# Patient Record
Sex: Male | Born: 1960 | Race: Black or African American | Hispanic: No | Smoking: Current every day smoker
Health system: Southern US, Community
[De-identification: ages and names within clinical notes are randomized; demographics above are authoritative.]

## PROBLEM LIST (undated history)

## (undated) ENCOUNTER — Emergency Department (HOSPITAL_COMMUNITY): Payer: Medicaid Other

## (undated) DIAGNOSIS — T7840XA Allergy, unspecified, initial encounter: Secondary | ICD-10-CM

## (undated) DIAGNOSIS — R609 Edema, unspecified: Secondary | ICD-10-CM

## (undated) DIAGNOSIS — T4145XA Adverse effect of unspecified anesthetic, initial encounter: Secondary | ICD-10-CM

## (undated) DIAGNOSIS — K08109 Complete loss of teeth, unspecified cause, unspecified class: Secondary | ICD-10-CM

## (undated) DIAGNOSIS — Z972 Presence of dental prosthetic device (complete) (partial): Secondary | ICD-10-CM

## (undated) DIAGNOSIS — J189 Pneumonia, unspecified organism: Secondary | ICD-10-CM

## (undated) DIAGNOSIS — K219 Gastro-esophageal reflux disease without esophagitis: Secondary | ICD-10-CM

## (undated) DIAGNOSIS — G039 Meningitis, unspecified: Secondary | ICD-10-CM

## (undated) DIAGNOSIS — H905 Unspecified sensorineural hearing loss: Secondary | ICD-10-CM

## (undated) DIAGNOSIS — R05 Cough: Secondary | ICD-10-CM

## (undated) DIAGNOSIS — T8859XA Other complications of anesthesia, initial encounter: Secondary | ICD-10-CM

## (undated) DIAGNOSIS — R053 Chronic cough: Secondary | ICD-10-CM

## (undated) DIAGNOSIS — M1990 Unspecified osteoarthritis, unspecified site: Secondary | ICD-10-CM

## (undated) DIAGNOSIS — H9190 Unspecified hearing loss, unspecified ear: Secondary | ICD-10-CM

## (undated) HISTORY — DX: Allergy, unspecified, initial encounter: T78.40XA

## (undated) HISTORY — PX: TONSILLECTOMY: SUR1361

## (undated) HISTORY — DX: Meningitis, unspecified: G03.9

## (undated) HISTORY — PX: ELBOW SURGERY: SHX618

## (undated) HISTORY — DX: Gastro-esophageal reflux disease without esophagitis: K21.9

## (undated) HISTORY — DX: Unspecified osteoarthritis, unspecified site: M19.90

## (undated) HISTORY — PX: INNER EAR SURGERY: SHX679

---
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
## 2004-05-09 ENCOUNTER — Ambulatory Visit: Payer: Self-pay | Admitting: Psychiatry

## 2004-05-15 ENCOUNTER — Ambulatory Visit: Payer: Self-pay | Admitting: Pain Medicine

## 2004-06-20 ENCOUNTER — Ambulatory Visit: Payer: Self-pay | Admitting: Pain Medicine

## 2004-07-01 ENCOUNTER — Ambulatory Visit: Payer: Self-pay | Admitting: Pain Medicine

## 2004-08-06 ENCOUNTER — Ambulatory Visit: Payer: Self-pay | Admitting: Pain Medicine

## 2006-10-31 ENCOUNTER — Emergency Department: Payer: Self-pay | Admitting: Emergency Medicine

## 2013-06-02 DIAGNOSIS — G039 Meningitis, unspecified: Secondary | ICD-10-CM

## 2013-06-02 HISTORY — DX: Meningitis, unspecified: G03.9

## 2013-07-03 HISTORY — PX: KNEE SURGERY: SHX244

## 2013-07-06 ENCOUNTER — Inpatient Hospital Stay: Payer: Self-pay | Admitting: Internal Medicine

## 2013-07-06 ENCOUNTER — Ambulatory Visit: Payer: Self-pay | Admitting: Neurology

## 2013-07-06 LAB — CBC WITH DIFFERENTIAL/PLATELET
BASOS ABS: 0 10*3/uL (ref 0.0–0.1)
BASOS PCT: 0.2 %
Eosinophil #: 0.2 10*3/uL (ref 0.0–0.7)
Eosinophil %: 0.7 %
HCT: 44.3 % (ref 40.0–52.0)
HGB: 15 g/dL (ref 13.0–18.0)
Lymphocyte #: 0.5 10*3/uL — ABNORMAL LOW (ref 1.0–3.6)
Lymphocyte %: 2.1 %
MCH: 32.2 pg (ref 26.0–34.0)
MCHC: 33.8 g/dL (ref 32.0–36.0)
MCV: 95 fL (ref 80–100)
Monocyte #: 1.5 x10 3/mm — ABNORMAL HIGH (ref 0.2–1.0)
Monocyte %: 6.5 %
Neutrophil #: 21.2 10*3/uL — ABNORMAL HIGH (ref 1.4–6.5)
Neutrophil %: 90.5 %
Platelet: 144 10*3/uL — ABNORMAL LOW (ref 150–440)
RBC: 4.65 10*6/uL (ref 4.40–5.90)
RDW: 13.6 % (ref 11.5–14.5)
WBC: 23.4 10*3/uL — AB (ref 3.8–10.6)

## 2013-07-06 LAB — COMPREHENSIVE METABOLIC PANEL
ALK PHOS: 52 U/L
ALT: 22 U/L (ref 12–78)
ANION GAP: 11 (ref 7–16)
Albumin: 2.5 g/dL — ABNORMAL LOW (ref 3.4–5.0)
BUN: 16 mg/dL (ref 7–18)
Bilirubin,Total: 0.8 mg/dL (ref 0.2–1.0)
CALCIUM: 7.6 mg/dL — AB (ref 8.5–10.1)
CO2: 19 mmol/L — AB (ref 21–32)
CREATININE: 1.16 mg/dL (ref 0.60–1.30)
Chloride: 105 mmol/L (ref 98–107)
EGFR (African American): >60
EGFR (Non-African Amer.): >60
GLUCOSE: 189 mg/dL — AB (ref 65–99)
Osmolality: 276 (ref 275–301)
Potassium: 2.9 mmol/L — ABNORMAL LOW (ref 3.5–5.1)
SGOT(AST): 42 U/L — ABNORMAL HIGH (ref 15–37)
Sodium: 135 mmol/L — ABNORMAL LOW (ref 136–145)
TOTAL PROTEIN: 6.1 g/dL — AB (ref 6.4–8.2)

## 2013-07-06 LAB — URINALYSIS, COMPLETE
Bacteria: NONE SEEN
Bilirubin,UR: NEGATIVE
Glucose,UR: 100 mg/dL (ref 0–75)
Leukocyte Esterase: NEGATIVE
Nitrite: NEGATIVE
PH: 6 (ref 4.5–8.0)
RBC,UR: 58 /HPF (ref 0–5)
SPECIFIC GRAVITY: 1.015 (ref 1.003–1.030)
Squamous Epithelial: <1
WBC UR: 2 /HPF (ref 0–5)

## 2013-07-06 LAB — RAPID INFLUENZA A&B ANTIGENS (ARMC ONLY)

## 2013-07-06 LAB — DRUG SCREEN, URINE
AMPHETAMINES, UR SCREEN: NEGATIVE (ref ?–1000)
Barbiturates, Ur Screen: NEGATIVE (ref ?–200)
Benzodiazepine, Ur Scrn: NEGATIVE (ref ?–200)
CANNABINOID 50 NG, UR ~~LOC~~: POSITIVE (ref ?–50)
Cocaine Metabolite,Ur ~~LOC~~: NEGATIVE (ref ?–300)
MDMA (ECSTASY) UR SCREEN: NEGATIVE (ref ?–500)
Methadone, Ur Screen: NEGATIVE (ref ?–300)
Opiate, Ur Screen: NEGATIVE (ref ?–300)
PHENCYCLIDINE (PCP) UR S: NEGATIVE (ref ?–25)
Tricyclic, Ur Screen: NEGATIVE (ref ?–1000)

## 2013-07-06 LAB — ETHANOL

## 2013-07-06 LAB — SALICYLATE LEVEL: Salicylates, Serum: <1.7 mg/dL

## 2013-07-06 LAB — PROTIME-INR
INR: 1
Prothrombin Time: 13.5 secs (ref 11.5–14.7)

## 2013-07-06 LAB — AMMONIA: Ammonia, Plasma: 16 mcmol/L (ref 11–32)

## 2013-07-06 LAB — CK TOTAL AND CKMB (NOT AT ARMC)
CK, TOTAL: 633 U/L — AB
CK-MB: 6.3 ng/mL — ABNORMAL HIGH (ref 0.5–3.6)

## 2013-07-06 LAB — TROPONIN I: Troponin-I: <0.02 ng/mL

## 2013-07-06 LAB — RAPID HIV-1/2 QL/CONFIRM: HIV-1/2, RAPID QL: NEGATIVE

## 2013-07-06 LAB — ACETAMINOPHEN LEVEL: Acetaminophen: <2 ug/mL

## 2013-07-06 LAB — APTT: Activated PTT: 29.8 secs (ref 23.6–35.9)

## 2013-07-07 LAB — BASIC METABOLIC PANEL
Anion Gap: 5 — ABNORMAL LOW (ref 7–16)
BUN: 15 mg/dL (ref 7–18)
CO2: 26 mmol/L (ref 21–32)
CREATININE: 1.13 mg/dL (ref 0.60–1.30)
Calcium, Total: 8.8 mg/dL (ref 8.5–10.1)
Chloride: 107 mmol/L (ref 98–107)
EGFR (African American): >60
Glucose: 117 mg/dL — ABNORMAL HIGH (ref 65–99)
Osmolality: 278 (ref 275–301)
Potassium: 4.1 mmol/L (ref 3.5–5.1)
Sodium: 138 mmol/L (ref 136–145)

## 2013-07-07 LAB — URINE CULTURE

## 2013-07-07 LAB — MAGNESIUM: Magnesium: 2.6 mg/dL — ABNORMAL HIGH

## 2013-07-07 LAB — CBC WITH DIFFERENTIAL/PLATELET
Basophil #: 0 10*3/uL (ref 0.0–0.1)
Basophil %: 0.1 %
Eosinophil #: 0.1 10*3/uL (ref 0.0–0.7)
Eosinophil %: 0.2 %
HCT: 40.5 % (ref 40.0–52.0)
HGB: 13.7 g/dL (ref 13.0–18.0)
LYMPHS PCT: 3 %
Lymphocyte #: 0.8 10*3/uL — ABNORMAL LOW (ref 1.0–3.6)
MCH: 32.3 pg (ref 26.0–34.0)
MCHC: 33.8 g/dL (ref 32.0–36.0)
MCV: 96 fL (ref 80–100)
MONO ABS: 2.5 x10 3/mm — AB (ref 0.2–1.0)
Monocyte %: 9.8 %
NEUTROS ABS: 21.7 10*3/uL — AB (ref 1.4–6.5)
NEUTROS PCT: 86.9 %
PLATELETS: 125 10*3/uL — AB (ref 150–440)
RBC: 4.23 10*6/uL — ABNORMAL LOW (ref 4.40–5.90)
RDW: 13.8 % (ref 11.5–14.5)
WBC: 25 10*3/uL — AB (ref 3.8–10.6)

## 2013-07-08 DIAGNOSIS — I38 Endocarditis, valve unspecified: Secondary | ICD-10-CM | POA: Diagnosis not present

## 2013-07-08 LAB — BASIC METABOLIC PANEL
Anion Gap: 2 — ABNORMAL LOW (ref 7–16)
BUN: 20 mg/dL — AB (ref 7–18)
CHLORIDE: 107 mmol/L (ref 98–107)
CO2: 28 mmol/L (ref 21–32)
CREATININE: 1.15 mg/dL (ref 0.60–1.30)
Calcium, Total: 8.5 mg/dL (ref 8.5–10.1)
EGFR (African American): >60
EGFR (Non-African Amer.): >60
GLUCOSE: 141 mg/dL — AB (ref 65–99)
Osmolality: 279 (ref 275–301)
POTASSIUM: 4.1 mmol/L (ref 3.5–5.1)
SODIUM: 137 mmol/L (ref 136–145)

## 2013-07-08 LAB — CULTURE, BLOOD (SINGLE)

## 2013-07-08 LAB — CBC WITH DIFFERENTIAL/PLATELET
Basophil #: 0 10*3/uL (ref 0.0–0.1)
Basophil %: 0.1 %
Eosinophil #: 0 10*3/uL (ref 0.0–0.7)
Eosinophil %: 0 %
HCT: 38.1 % — ABNORMAL LOW (ref 40.0–52.0)
HGB: 13 g/dL (ref 13.0–18.0)
Lymphocyte #: 1.1 10*3/uL (ref 1.0–3.6)
Lymphocyte %: 4.4 %
MCH: 32.6 pg (ref 26.0–34.0)
MCHC: 34 g/dL (ref 32.0–36.0)
MCV: 96 fL (ref 80–100)
MONO ABS: 2.8 x10 3/mm — AB (ref 0.2–1.0)
MONOS PCT: 11.1 %
NEUTROS PCT: 84.4 %
Neutrophil #: 21 10*3/uL — ABNORMAL HIGH (ref 1.4–6.5)
Platelet: 122 10*3/uL — ABNORMAL LOW (ref 150–440)
RBC: 3.98 10*6/uL — ABNORMAL LOW (ref 4.40–5.90)
RDW: 13.9 % (ref 11.5–14.5)
WBC: 24.9 10*3/uL — AB (ref 3.8–10.6)

## 2013-07-08 LAB — MAGNESIUM: MAGNESIUM: 2.5 mg/dL — AB

## 2013-07-08 LAB — TRIGLYCERIDES: Triglycerides: 256 mg/dL — ABNORMAL HIGH (ref 0–200)

## 2013-07-08 LAB — PHOSPHORUS: Phosphorus: 1.8 mg/dL — ABNORMAL LOW (ref 2.5–4.9)

## 2013-07-08 LAB — VANCOMYCIN, TROUGH: Vancomycin, Trough: 15 ug/mL (ref 10–20)

## 2013-07-09 LAB — CBC WITH DIFFERENTIAL/PLATELET
BASOS PCT: 0.3 %
Basophil #: 0.1 10*3/uL (ref 0.0–0.1)
EOS PCT: 0.1 %
Eosinophil #: 0 10*3/uL (ref 0.0–0.7)
HCT: 37.4 % — AB (ref 40.0–52.0)
HGB: 12.8 g/dL — ABNORMAL LOW (ref 13.0–18.0)
Lymphocyte #: 1.4 10*3/uL (ref 1.0–3.6)
Lymphocyte %: 7.7 %
MCH: 32.6 pg (ref 26.0–34.0)
MCHC: 34.3 g/dL (ref 32.0–36.0)
MCV: 95 fL (ref 80–100)
MONO ABS: 2.5 x10 3/mm — AB (ref 0.2–1.0)
Monocyte %: 13.4 %
NEUTROS ABS: 14.5 10*3/uL — AB (ref 1.4–6.5)
Neutrophil %: 78.5 %
PLATELETS: 161 10*3/uL (ref 150–440)
RBC: 3.94 10*6/uL — ABNORMAL LOW (ref 4.40–5.90)
RDW: 14 % (ref 11.5–14.5)
WBC: 18.4 10*3/uL — ABNORMAL HIGH (ref 3.8–10.6)

## 2013-07-09 LAB — BASIC METABOLIC PANEL
ANION GAP: 1 — AB (ref 7–16)
BUN: 20 mg/dL — AB (ref 7–18)
CHLORIDE: 108 mmol/L — AB (ref 98–107)
CREATININE: 1 mg/dL (ref 0.60–1.30)
Calcium, Total: 8.6 mg/dL (ref 8.5–10.1)
Co2: 32 mmol/L (ref 21–32)
EGFR (African American): >60
EGFR (Non-African Amer.): >60
Glucose: 147 mg/dL — ABNORMAL HIGH (ref 65–99)
Osmolality: 287 (ref 275–301)
Potassium: 3.9 mmol/L (ref 3.5–5.1)
Sodium: 141 mmol/L (ref 136–145)

## 2013-07-09 LAB — PHOSPHORUS
Phosphorus: 1.8 mg/dL — ABNORMAL LOW (ref 2.5–4.9)
Phosphorus: 2.6 mg/dL (ref 2.5–4.9)

## 2013-07-09 LAB — MAGNESIUM: MAGNESIUM: 2.3 mg/dL

## 2013-07-10 LAB — BASIC METABOLIC PANEL
ANION GAP: 2 — AB (ref 7–16)
BUN: 20 mg/dL — ABNORMAL HIGH (ref 7–18)
CO2: 34 mmol/L — AB (ref 21–32)
CREATININE: 1.05 mg/dL (ref 0.60–1.30)
Calcium, Total: 8.5 mg/dL (ref 8.5–10.1)
Chloride: 108 mmol/L — ABNORMAL HIGH (ref 98–107)
EGFR (African American): >60
EGFR (Non-African Amer.): >60
Glucose: 126 mg/dL — ABNORMAL HIGH (ref 65–99)
Osmolality: 291 (ref 275–301)
POTASSIUM: 3.7 mmol/L (ref 3.5–5.1)
SODIUM: 144 mmol/L (ref 136–145)

## 2013-07-10 LAB — CBC WITH DIFFERENTIAL/PLATELET
BANDS NEUTROPHIL: 2 %
COMMENT - H1-COM3: NORMAL
HCT: 40 % (ref 40.0–52.0)
HGB: 13.2 g/dL (ref 13.0–18.0)
Lymphocytes: 10 %
MCH: 31.5 pg (ref 26.0–34.0)
MCHC: 33 g/dL (ref 32.0–36.0)
MCV: 96 fL (ref 80–100)
MONOS PCT: 13 %
PLATELETS: 233 10*3/uL (ref 150–440)
RBC: 4.19 10*6/uL — AB (ref 4.40–5.90)
RDW: 14 % (ref 11.5–14.5)
Segmented Neutrophils: 68 %
Variant Lymphocyte - H1-Rlymph: 7 %
WBC: 19.9 10*3/uL — AB (ref 3.8–10.6)

## 2013-07-10 LAB — CLOSTRIDIUM DIFFICILE(ARMC)

## 2013-07-10 LAB — PHOSPHORUS: Phosphorus: 2.5 mg/dL (ref 2.5–4.9)

## 2013-07-10 LAB — MAGNESIUM: Magnesium: 2.2 mg/dL

## 2013-07-11 LAB — BASIC METABOLIC PANEL
Anion Gap: 1 — ABNORMAL LOW (ref 7–16)
BUN: 26 mg/dL — AB (ref 7–18)
CHLORIDE: 108 mmol/L — AB (ref 98–107)
CREATININE: 0.89 mg/dL (ref 0.60–1.30)
Calcium, Total: 8.6 mg/dL (ref 8.5–10.1)
Co2: 35 mmol/L — ABNORMAL HIGH (ref 21–32)
EGFR (African American): >60
EGFR (Non-African Amer.): >60
GLUCOSE: 165 mg/dL — AB (ref 65–99)
OSMOLALITY: 295 (ref 275–301)
Potassium: 3.9 mmol/L (ref 3.5–5.1)
Sodium: 144 mmol/L (ref 136–145)

## 2013-07-11 LAB — CBC WITH DIFFERENTIAL/PLATELET
Bands: 1 %
Comment - H1-Com2: NORMAL
HCT: 40.5 % (ref 40.0–52.0)
HGB: 13.2 g/dL (ref 13.0–18.0)
Lymphocytes: 12 %
MCH: 31.5 pg (ref 26.0–34.0)
MCHC: 32.5 g/dL (ref 32.0–36.0)
MCV: 97 fL (ref 80–100)
METAMYELOCYTE: 2 %
MONOS PCT: 5 %
Myelocyte: 2 %
NRBC/100 WBC: 1 /
PLATELETS: 296 10*3/uL (ref 150–440)
RBC: 4.19 10*6/uL — ABNORMAL LOW (ref 4.40–5.90)
RDW: 14.2 % (ref 11.5–14.5)
SEGMENTED NEUTROPHILS: 74 %
Variant Lymphocyte - H1-Rlymph: 4 %
WBC: 21 10*3/uL — ABNORMAL HIGH (ref 3.8–10.6)

## 2013-07-11 LAB — WOUND CULTURE

## 2013-07-11 LAB — PHOSPHORUS: PHOSPHORUS: 3 mg/dL (ref 2.5–4.9)

## 2013-07-11 LAB — MAGNESIUM: Magnesium: 2.5 mg/dL — ABNORMAL HIGH

## 2013-07-12 LAB — PHOSPHORUS: Phosphorus: 2.5 mg/dL (ref 2.5–4.9)

## 2013-07-12 LAB — BASIC METABOLIC PANEL
ANION GAP: 0 — AB (ref 7–16)
BUN: 29 mg/dL — ABNORMAL HIGH (ref 7–18)
CREATININE: 0.95 mg/dL (ref 0.60–1.30)
Calcium, Total: 9.1 mg/dL (ref 8.5–10.1)
Chloride: 111 mmol/L — ABNORMAL HIGH (ref 98–107)
Co2: 35 mmol/L — ABNORMAL HIGH (ref 21–32)
EGFR (African American): >60
EGFR (Non-African Amer.): >60
GLUCOSE: 162 mg/dL — AB (ref 65–99)
OSMOLALITY: 300 (ref 275–301)
Potassium: 4.1 mmol/L (ref 3.5–5.1)
Sodium: 146 mmol/L — ABNORMAL HIGH (ref 136–145)

## 2013-07-12 LAB — MAGNESIUM: Magnesium: 2.3 mg/dL

## 2013-07-13 LAB — CBC WITH DIFFERENTIAL/PLATELET
Basophil #: 0.1 10*3/uL (ref 0.0–0.1)
Basophil %: 0.4 %
EOS ABS: 0 10*3/uL (ref 0.0–0.7)
Eosinophil %: 0.1 %
HCT: 39.2 % — AB (ref 40.0–52.0)
HGB: 13.3 g/dL (ref 13.0–18.0)
LYMPHS PCT: 12.5 %
Lymphocyte #: 2.3 10*3/uL (ref 1.0–3.6)
MCH: 32.7 pg (ref 26.0–34.0)
MCHC: 33.9 g/dL (ref 32.0–36.0)
MCV: 97 fL (ref 80–100)
MONOS PCT: 13.7 %
Monocyte #: 2.5 x10 3/mm — ABNORMAL HIGH (ref 0.2–1.0)
Neutrophil #: 13.4 10*3/uL — ABNORMAL HIGH (ref 1.4–6.5)
Neutrophil %: 73.3 %
Platelet: 415 10*3/uL (ref 150–440)
RBC: 4.06 10*6/uL — ABNORMAL LOW (ref 4.40–5.90)
RDW: 14.4 % (ref 11.5–14.5)
WBC: 18.3 10*3/uL — ABNORMAL HIGH (ref 3.8–10.6)

## 2013-07-13 LAB — BASIC METABOLIC PANEL
Anion Gap: 6 — ABNORMAL LOW (ref 7–16)
BUN: 25 mg/dL — ABNORMAL HIGH (ref 7–18)
CALCIUM: 9.3 mg/dL (ref 8.5–10.1)
CHLORIDE: 108 mmol/L — AB (ref 98–107)
Co2: 31 mmol/L (ref 21–32)
Creatinine: 0.89 mg/dL (ref 0.60–1.30)
EGFR (African American): >60
Glucose: 116 mg/dL — ABNORMAL HIGH (ref 65–99)
Osmolality: 294 (ref 275–301)
Potassium: 3.9 mmol/L (ref 3.5–5.1)
SODIUM: 145 mmol/L (ref 136–145)

## 2013-07-14 LAB — CBC WITH DIFFERENTIAL/PLATELET
BASOS PCT: 0.2 %
Basophil #: 0 10*3/uL (ref 0.0–0.1)
Eosinophil #: 0 10*3/uL (ref 0.0–0.7)
Eosinophil %: 0.2 %
HCT: 39.2 % — ABNORMAL LOW (ref 40.0–52.0)
HGB: 13.2 g/dL (ref 13.0–18.0)
LYMPHS PCT: 11.1 %
Lymphocyte #: 2 10*3/uL (ref 1.0–3.6)
MCH: 32.6 pg (ref 26.0–34.0)
MCHC: 33.8 g/dL (ref 32.0–36.0)
MCV: 97 fL (ref 80–100)
Monocyte #: 2.9 x10 3/mm — ABNORMAL HIGH (ref 0.2–1.0)
Monocyte %: 16.1 %
NEUTROS PCT: 72.4 %
Neutrophil #: 13 10*3/uL — ABNORMAL HIGH (ref 1.4–6.5)
PLATELETS: 420 10*3/uL (ref 150–440)
RBC: 4.05 10*6/uL — ABNORMAL LOW (ref 4.40–5.90)
RDW: 14.2 % (ref 11.5–14.5)
WBC: 18 10*3/uL — ABNORMAL HIGH (ref 3.8–10.6)

## 2013-07-14 LAB — URINALYSIS, COMPLETE
BILIRUBIN, UR: NEGATIVE
Glucose,UR: NEGATIVE mg/dL (ref 0–75)
Ketone: NEGATIVE
Nitrite: NEGATIVE
Ph: 7 (ref 4.5–8.0)
Protein: 30
SPECIFIC GRAVITY: 1.015 (ref 1.003–1.030)
Squamous Epithelial: NONE SEEN
WBC UR: 26 /HPF (ref 0–5)

## 2013-07-14 LAB — CULTURE, BLOOD (SINGLE)

## 2013-07-16 LAB — CSF CELL CT + PROT + GLU PANEL
CSF Tube #: 3
Eosinophil: 0 %
GLUCOSE, CSF: 68 mg/dL (ref 40–75)
LYMPHS PCT: 81 %
Monocytes/Macrophages: 5 %
Neutrophils: 14 %
Other Cells: 0 %
Protein, CSF: 196 mg/dL — ABNORMAL HIGH (ref 15–45)
RBC (CSF): 0 /mm3
WBC (CSF): 51 /mm3

## 2013-07-16 LAB — BASIC METABOLIC PANEL
ANION GAP: 7 (ref 7–16)
BUN: 20 mg/dL — AB (ref 7–18)
CHLORIDE: 98 mmol/L (ref 98–107)
CO2: 26 mmol/L (ref 21–32)
Calcium, Total: 9.4 mg/dL (ref 8.5–10.1)
Creatinine: 1.05 mg/dL (ref 0.60–1.30)
EGFR (African American): >60
EGFR (Non-African Amer.): >60
Glucose: 163 mg/dL — ABNORMAL HIGH (ref 65–99)
OSMOLALITY: 269 (ref 275–301)
POTASSIUM: 4.3 mmol/L (ref 3.5–5.1)
Sodium: 131 mmol/L — ABNORMAL LOW (ref 136–145)

## 2013-07-16 LAB — CBC WITH DIFFERENTIAL/PLATELET
Basophil: 1 %
COMMENT - H1-COM3: NORMAL
HCT: 39.4 % — ABNORMAL LOW (ref 40.0–52.0)
HGB: 13.3 g/dL (ref 13.0–18.0)
Lymphocytes: 9 %
MCH: 32.5 pg (ref 26.0–34.0)
MCHC: 33.7 g/dL (ref 32.0–36.0)
MCV: 96 fL (ref 80–100)
Monocytes: 11 %
Platelet: 450 10*3/uL — ABNORMAL HIGH (ref 150–440)
RBC: 4.09 10*6/uL — AB (ref 4.40–5.90)
RDW: 14 % (ref 11.5–14.5)
Segmented Neutrophils: 79 %
WBC: 18.8 10*3/uL — AB (ref 3.8–10.6)

## 2013-07-16 LAB — URINE CULTURE

## 2013-07-17 LAB — BASIC METABOLIC PANEL
Anion Gap: 4 — ABNORMAL LOW (ref 7–16)
BUN: 20 mg/dL — AB (ref 7–18)
CO2: 27 mmol/L (ref 21–32)
CREATININE: 1.04 mg/dL (ref 0.60–1.30)
Calcium, Total: 9.3 mg/dL (ref 8.5–10.1)
Chloride: 100 mmol/L (ref 98–107)
EGFR (African American): >60
EGFR (Non-African Amer.): >60
GLUCOSE: 128 mg/dL — AB (ref 65–99)
Osmolality: 267 (ref 275–301)
Potassium: 4.5 mmol/L (ref 3.5–5.1)
Sodium: 131 mmol/L — ABNORMAL LOW (ref 136–145)

## 2013-07-17 LAB — CBC WITH DIFFERENTIAL/PLATELET
Basophil #: 0.1 10*3/uL (ref 0.0–0.1)
Basophil %: 0.6 %
EOS PCT: 0.4 %
Eosinophil #: 0.1 10*3/uL (ref 0.0–0.7)
HCT: 38 % — ABNORMAL LOW (ref 40.0–52.0)
HGB: 12.5 g/dL — AB (ref 13.0–18.0)
LYMPHS ABS: 1.4 10*3/uL (ref 1.0–3.6)
LYMPHS PCT: 8.6 %
MCH: 31.4 pg (ref 26.0–34.0)
MCHC: 32.9 g/dL (ref 32.0–36.0)
MCV: 95 fL (ref 80–100)
MONO ABS: 2.9 x10 3/mm — AB (ref 0.2–1.0)
MONOS PCT: 17.7 %
NEUTROS PCT: 72.7 %
Neutrophil #: 11.8 10*3/uL — ABNORMAL HIGH (ref 1.4–6.5)
Platelet: 456 10*3/uL — ABNORMAL HIGH (ref 150–440)
RBC: 3.99 10*6/uL — ABNORMAL LOW (ref 4.40–5.90)
RDW: 13.9 % (ref 11.5–14.5)
WBC: 16.3 10*3/uL — ABNORMAL HIGH (ref 3.8–10.6)

## 2013-07-18 LAB — CBC WITH DIFFERENTIAL/PLATELET
Basophil #: 0.1 10*3/uL (ref 0.0–0.1)
Basophil %: 0.5 %
Eosinophil #: 0.1 10*3/uL (ref 0.0–0.7)
Eosinophil %: 0.8 %
HCT: 37.6 % — ABNORMAL LOW (ref 40.0–52.0)
HGB: 12.4 g/dL — AB (ref 13.0–18.0)
LYMPHS ABS: 1.2 10*3/uL (ref 1.0–3.6)
Lymphocyte %: 6.3 %
MCH: 31.7 pg (ref 26.0–34.0)
MCHC: 33 g/dL (ref 32.0–36.0)
MCV: 96 fL (ref 80–100)
Monocyte #: 2.6 x10 3/mm — ABNORMAL HIGH (ref 0.2–1.0)
Monocyte %: 14.3 %
NEUTROS ABS: 14.3 10*3/uL — AB (ref 1.4–6.5)
NEUTROS PCT: 78.1 %
PLATELETS: 538 10*3/uL — AB (ref 150–440)
RBC: 3.92 10*6/uL — ABNORMAL LOW (ref 4.40–5.90)
RDW: 14 % (ref 11.5–14.5)
WBC: 18.4 10*3/uL — ABNORMAL HIGH (ref 3.8–10.6)

## 2013-07-18 LAB — BASIC METABOLIC PANEL
Anion Gap: 6 — ABNORMAL LOW (ref 7–16)
BUN: 17 mg/dL (ref 7–18)
CALCIUM: 9.5 mg/dL (ref 8.5–10.1)
CHLORIDE: 97 mmol/L — AB (ref 98–107)
CO2: 27 mmol/L (ref 21–32)
Creatinine: 1 mg/dL (ref 0.60–1.30)
EGFR (Non-African Amer.): >60
GLUCOSE: 116 mg/dL — AB (ref 65–99)
OSMOLALITY: 263 (ref 275–301)
Potassium: 4.1 mmol/L (ref 3.5–5.1)
Sodium: 130 mmol/L — ABNORMAL LOW (ref 136–145)

## 2013-07-19 LAB — BASIC METABOLIC PANEL
Anion Gap: 2 — ABNORMAL LOW (ref 7–16)
BUN: 20 mg/dL — ABNORMAL HIGH (ref 7–18)
Calcium, Total: 9.3 mg/dL (ref 8.5–10.1)
Chloride: 100 mmol/L (ref 98–107)
Co2: 31 mmol/L (ref 21–32)
Creatinine: 1.03 mg/dL (ref 0.60–1.30)
EGFR (African American): >60
Glucose: 125 mg/dL — ABNORMAL HIGH (ref 65–99)
OSMOLALITY: 270 (ref 275–301)
POTASSIUM: 3.9 mmol/L (ref 3.5–5.1)
Sodium: 133 mmol/L — ABNORMAL LOW (ref 136–145)

## 2013-07-19 LAB — CBC WITH DIFFERENTIAL/PLATELET
BASOS PCT: 0.8 %
Basophil #: 0.1 10*3/uL (ref 0.0–0.1)
Eosinophil #: 0.2 10*3/uL (ref 0.0–0.7)
Eosinophil %: 1.2 %
HCT: 34.2 % — ABNORMAL LOW (ref 40.0–52.0)
HGB: 11.3 g/dL — AB (ref 13.0–18.0)
LYMPHS PCT: 11 %
Lymphocyte #: 1.8 10*3/uL (ref 1.0–3.6)
MCH: 31.9 pg (ref 26.0–34.0)
MCHC: 33.2 g/dL (ref 32.0–36.0)
MCV: 96 fL (ref 80–100)
Monocyte #: 2.3 x10 3/mm — ABNORMAL HIGH (ref 0.2–1.0)
Monocyte %: 14.2 %
NEUTROS ABS: 11.6 10*3/uL — AB (ref 1.4–6.5)
Neutrophil %: 72.8 %
PLATELETS: 527 10*3/uL — AB (ref 150–440)
RBC: 3.56 10*6/uL — ABNORMAL LOW (ref 4.40–5.90)
RDW: 13.9 % (ref 11.5–14.5)
WBC: 15.9 10*3/uL — ABNORMAL HIGH (ref 3.8–10.6)

## 2013-07-19 LAB — CULTURE, BLOOD (SINGLE)

## 2013-07-19 LAB — CSF CULTURE

## 2013-07-19 LAB — VANCOMYCIN, TROUGH: Vancomycin, Trough: 10 ug/mL (ref 10–20)

## 2013-07-20 LAB — URINALYSIS, COMPLETE
BACTERIA: NONE SEEN
BILIRUBIN, UR: NEGATIVE
Glucose,UR: NEGATIVE mg/dL (ref 0–75)
KETONE: NEGATIVE
Leukocyte Esterase: NEGATIVE
Nitrite: NEGATIVE
Ph: 5 (ref 4.5–8.0)
Protein: 30
RBC,UR: 6 /HPF (ref 0–5)
Specific Gravity: 1.016 (ref 1.003–1.030)
Squamous Epithelial: 1
WBC UR: 9 /HPF (ref 0–5)

## 2013-07-20 LAB — CBC WITH DIFFERENTIAL/PLATELET
BASOS ABS: 0.1 10*3/uL (ref 0.0–0.1)
BASOS PCT: 0.5 %
EOS ABS: 0.3 10*3/uL (ref 0.0–0.7)
Eosinophil %: 2.5 %
HCT: 30.8 % — AB (ref 40.0–52.0)
HGB: 10.8 g/dL — ABNORMAL LOW (ref 13.0–18.0)
Lymphocyte #: 1.5 10*3/uL (ref 1.0–3.6)
Lymphocyte %: 11 %
MCH: 33.3 pg (ref 26.0–34.0)
MCHC: 35 g/dL (ref 32.0–36.0)
MCV: 95 fL (ref 80–100)
MONO ABS: 1.8 x10 3/mm — AB (ref 0.2–1.0)
Monocyte %: 13 %
NEUTROS PCT: 73 %
Neutrophil #: 10.1 10*3/uL — ABNORMAL HIGH (ref 1.4–6.5)
Platelet: 528 10*3/uL — ABNORMAL HIGH (ref 150–440)
RBC: 3.24 10*6/uL — AB (ref 4.40–5.90)
RDW: 13.7 % (ref 11.5–14.5)
WBC: 13.8 10*3/uL — ABNORMAL HIGH (ref 3.8–10.6)

## 2013-07-20 LAB — BASIC METABOLIC PANEL
Anion Gap: 3 — ABNORMAL LOW (ref 7–16)
BUN: 16 mg/dL (ref 7–18)
CALCIUM: 8.8 mg/dL (ref 8.5–10.1)
CHLORIDE: 98 mmol/L (ref 98–107)
CO2: 31 mmol/L (ref 21–32)
Creatinine: 1.05 mg/dL (ref 0.60–1.30)
EGFR (African American): >60
EGFR (Non-African Amer.): >60
Glucose: 114 mg/dL — ABNORMAL HIGH (ref 65–99)
Osmolality: 267 (ref 275–301)
Potassium: 3.7 mmol/L (ref 3.5–5.1)
Sodium: 132 mmol/L — ABNORMAL LOW (ref 136–145)

## 2013-07-21 LAB — BODY FLUID CELL COUNT WITH DIFFERENTIAL
BASOS ABS: 0 %
Eosinophil: 0 %
Lymphocytes: 2 %
Neutrophils: 96 %
Nucleated Cell Count: 25943 /mm3
Other Cells BF: 0 %
Other Mononuclear Cells: 2 %

## 2013-07-21 LAB — CBC WITH DIFFERENTIAL/PLATELET
Basophil #: 0.2 10*3/uL — ABNORMAL HIGH (ref 0.0–0.1)
Basophil %: 1.4 %
EOS PCT: 2.6 %
Eosinophil #: 0.3 10*3/uL (ref 0.0–0.7)
HCT: 27.9 % — ABNORMAL LOW (ref 40.0–52.0)
HGB: 9.4 g/dL — AB (ref 13.0–18.0)
Lymphocyte #: 1.2 10*3/uL (ref 1.0–3.6)
Lymphocyte %: 9.2 %
MCH: 32.3 pg (ref 26.0–34.0)
MCHC: 33.8 g/dL (ref 32.0–36.0)
MCV: 95 fL (ref 80–100)
Monocyte #: 1.5 x10 3/mm — ABNORMAL HIGH (ref 0.2–1.0)
Monocyte %: 11.8 %
NEUTROS ABS: 9.9 10*3/uL — AB (ref 1.4–6.5)
Neutrophil %: 75 %
PLATELETS: 494 10*3/uL — AB (ref 150–440)
RBC: 2.93 10*6/uL — AB (ref 4.40–5.90)
RDW: 13.4 % (ref 11.5–14.5)
WBC: 13.2 10*3/uL — ABNORMAL HIGH (ref 3.8–10.6)

## 2013-07-21 LAB — VANCOMYCIN, TROUGH: VANCOMYCIN, TROUGH: 16 ug/mL (ref 10–20)

## 2013-07-21 LAB — URINE CULTURE

## 2013-07-22 LAB — CREATININE, SERUM
CREATININE: 0.86 mg/dL (ref 0.60–1.30)
EGFR (African American): >60
EGFR (Non-African Amer.): >60

## 2013-07-23 ENCOUNTER — Ambulatory Visit (HOSPITAL_COMMUNITY)
Admission: AD | Admit: 2013-07-23 | Discharge: 2013-07-23 | Disposition: A | Discharge status: Home / Self Care | Payer: Self-pay | Source: Other Acute Inpatient Hospital | Attending: Internal Medicine | Admitting: Internal Medicine

## 2013-07-23 DIAGNOSIS — R4182 Altered mental status, unspecified: Secondary | ICD-10-CM | POA: Insufficient documentation

## 2013-07-23 LAB — CBC WITH DIFFERENTIAL/PLATELET
BASOS ABS: 0.1 10*3/uL (ref 0.0–0.1)
BASOS PCT: 1.2 %
EOS ABS: 0.4 10*3/uL (ref 0.0–0.7)
Eosinophil %: 4 %
HCT: 29.9 % — AB (ref 40.0–52.0)
HGB: 9.9 g/dL — ABNORMAL LOW (ref 13.0–18.0)
LYMPHS ABS: 1.9 10*3/uL (ref 1.0–3.6)
Lymphocyte %: 17 %
MCH: 31.7 pg (ref 26.0–34.0)
MCHC: 33.1 g/dL (ref 32.0–36.0)
MCV: 96 fL (ref 80–100)
MONOS PCT: 10.3 %
Monocyte #: 1.1 x10 3/mm — ABNORMAL HIGH (ref 0.2–1.0)
Neutrophil #: 7.4 10*3/uL — ABNORMAL HIGH (ref 1.4–6.5)
Neutrophil %: 67.5 %
Platelet: 630 10*3/uL — ABNORMAL HIGH (ref 150–440)
RBC: 3.13 10*6/uL — ABNORMAL LOW (ref 4.40–5.90)
RDW: 13.8 % (ref 11.5–14.5)
WBC: 10.9 10*3/uL — ABNORMAL HIGH (ref 3.8–10.6)

## 2013-07-23 LAB — CULTURE, BLOOD (SINGLE)

## 2013-07-23 LAB — CREATININE, SERUM
Creatinine: 0.81 mg/dL (ref 0.60–1.30)
EGFR (African American): >60

## 2013-07-23 LAB — VANCOMYCIN, TROUGH: Vancomycin, Trough: 15 ug/mL (ref 10–20)

## 2013-07-24 LAB — CULTURE, BLOOD (SINGLE)

## 2013-07-25 LAB — WOUND CULTURE

## 2013-08-30 DIAGNOSIS — H9319 Tinnitus, unspecified ear: Secondary | ICD-10-CM | POA: Insufficient documentation

## 2013-08-30 DIAGNOSIS — H903 Sensorineural hearing loss, bilateral: Secondary | ICD-10-CM | POA: Insufficient documentation

## 2014-01-18 DIAGNOSIS — R0683 Snoring: Secondary | ICD-10-CM | POA: Insufficient documentation

## 2014-01-18 DIAGNOSIS — K219 Gastro-esophageal reflux disease without esophagitis: Secondary | ICD-10-CM | POA: Insufficient documentation

## 2014-09-23 NOTE — Op Note (Signed)
PATIENT NAME:  Raymond Gibson, Raymond Gibson MR#:  [id number] DATE OF BIRTH:  1960-06-24  DATE OF PROCEDURE:  07/12/2013  PREOPERATIVE DIAGNOSIS: Right septic knee.  POSTOPERATIVE DIAGNOSIS: Right septic knee.   PROCEDURE: Lavage, right knee.   ANESTHESIA: Local.  INDICATIONS: The patient is a 54 year old, who has been in CCU. He is currently intubated. He has had recurrent effusions. He is still having fevers. He has had prior lavage and now, because of increasing swelling to the knee and repeat lavage indicated.   DESCRIPTION OF PROCEDURE: The timeout procedure was completed. The anterior knee was prepped with a DuraPrep and then 10 mL of 1% Lidocaine was infiltrated in the area of the prior incisions. These incisions were then re-opened with 11 blade and trocars entered. This was hooked up to 3 L bags of saline and suction. Superior medial and superior lateral cannulas being placed. Irrigation was carried out. There was significant fragments of, what appeared to be, debris that came through the system consistent with infected synovium. After thorough irrigation of the knee, the fluid was clear. There was no thick gross pus seen at the start of the case. After thorough irrigation, the trocars were removed and 4 x 4's and tape applied.   ESTIMATED BLOOD LOSS: Minimal.   COMPLICATIONS: None.   SPECIMENS: None.   ____________________________ Laurene Footman, MD [email or web address] D: 07/13/2013 07:54:33 ET T: 07/13/2013 08:15:32 ET JOB#: [id number]  cc: Laurene Footman, MD, <Dictator> Laurene Footman MD ELECTRONICALLY SIGNED 07/14/2013 7:57

## 2014-09-23 NOTE — Consult Note (Signed)
PATIENT NAME:  Raymond Gibson, DISNEY MR#:  [id number] DATE OF BIRTH:  Jan 31, 1961  DATE OF CONSULTATION:  07/06/2013  CONSULTING PHYSICIAN:  Laurene Footman, MD  REASON FOR CONSULTATION: Septic right knee.   HISTORY OF PRESENT ILLNESS: The patient is a 54 year old who apparently has meningitis, acute mental status change. Had been able to work and ambulate normally a week ago. He was seen in the Emergency Room by Dr. Ola Spurr. He had aspiration of over 100 mL of purulent material from the knee.   He was seen after this in the CCU. He is intubated and is unable to give any response. On exam, his knee has the area of prior needle aspiration. His knee right now at present does not have an effusion, but with gross purulent material, he will require flushing of the knee. I think with his mental status, it is not safe to do this inside surgery with anesthesia. Will need to do it at bedside and plan on that tomorrow.   ____________________________ Laurene Footman, MD [email or web address] D: 07/07/2013 07:28:00 ET T: 07/07/2013 07:58:25 ET JOB#: [id number]  cc: Laurene Footman, MD, <Dictator> Laurene Footman MD ELECTRONICALLY SIGNED 07/07/2013 10:41

## 2014-09-23 NOTE — Consult Note (Signed)
CHIEF COMPLAINT and HISTORY:  History of Present Illness 54 year old admitted 07/09/13 with altered mental status/encephalopathy. Concerns of encephalitis vs meningitis. S/p LP today. Also found to have sepsis possibly from right knee infection +blood cultures with strep pneumonae 2/4 on [place]. S/p lavage by ortho of knee. Patient does not provide any history to me at this time. He just had PICC line placed.  Consulted for vent thrombosis, ongoing fevers, concern of infected thrombus, should triple lumen catheter left IJ be removed.   PAST MEDICAL/SURGICAL HISTORY:  Past Medical History:   Denies medical history:   ALLERGIES:  Allergies:  Rocephin: Blisters  Family and Social History:  Family History Stroke  from chart   Social History positive  tobacco, positive ETOH, negative Illicit drugs   + Tobacco Current (within 1 year)   Review of Systems:  ROS Pt not able to provide ROS  Just had PICC placed, drowsy, won't really answer questions much   Physical Exam:  GEN no acute distress   HEENT hearing intact to voice   NECK No masses   RESP normal resp effort   CARD regular rate   VASCULAR ACCESS Right arm picc, central line out   ABD denies tenderness  soft   SKIN normal to palpation   PSYCH drowsy right now   LABS:  Laboratory Results: LabObservation:    06-Feb-15 13:50, Echo Doppler  OBSERVATION   Reason for Test    13-Feb-15 10:44, [place] Color Doppler Upper Extrem Bilat (Arms)  OBSERVATION   Reason for Test LIJ thrombosis, sepsis, ongoing fevers, r/o DVT  Hepatic:    04-Feb-15 10:25, Comprehensive Metabolic Panel  Bilirubin, Total 0.8  Alkaline Phosphatase 52  45-117  NOTE: New Reference Range  04/22/13  SGPT (ALT) 22  SGOT (AST) 42  Total Protein, Serum 6.1  Albumin, Serum 2.5  TDMs:    06-Feb-15 09:22, Vancomycin, Trough LAB  Vancomycin, Trough LAB 15  Routine Micro:    04-Feb-15 10:25, Urine Culture  Micro Text Report   URINE CULTURE    COMMENT                    NO GROWTH IN 18-24 HOURS     ANTIBIOTIC  Specimen Source   IN AND OUT CATH  Culture Comment   NO GROWTH IN 18-24 HOURS   Result(s) reported on 07 Jul 2013 at 08:07AM.    04-Feb-15 10:33, Blood Culture  Organism Name   STREPTOCOCCUS PNEUMONIAE  Ceftriaxone Sensitivity S/0.064  Oxacillin Sensitivity R  Erythromycin Sensitivity S/0.094  Vancomycin Sensitivity S/0.19  Levofloxacin Sensitivity S/0.50  Penicillin MIC S/0.125  Micro Text Report   BLOOD CULTURE    ORGANISM 1                STREPTOCOCCUS PNEUMONIAE    COMMENT                   IN AEROBIC AND ANAEROBIC BOTTLES    COMMENT                   -    GRAM STAIN                GRAM POSITIVE COCCI     ANTIBIOTIC                    ORG#1    CEFTRIAXONE                   S/0.064  ERYTHROMYCIN                  S/0.094    LEVOFLOXACIN                  S/0.50     OXACILLIN                     R          VANCOMYCIN                    S/0.19     PENICILLIN MIC                S/0.125  Organism 1   STREPTOCOCCUS PNEUMONIAE  Culture Comment   IN AEROBIC AND ANAEROBIC BOTTLES  Culture Comment . -  Gram Stain 1   GRAM POSITIVE COCCI  Organism Name   STREPTOCOCCUS PNEUMONIAE  Ceftriaxone Sensitivity S/0.064  Oxacillin Sensitivity R  Erythromycin Sensitivity S/0.125  Vancomycin Sensitivity S/0.25  Levofloxacin Sensitivity S/0.50  Penicillin MIC S/0.125  Micro Text Report   BLOOD CULTURE    ORGANISM 1                STREPTOCOCCUS PNEUMONIAE    COMMENT                   IN AEROBIC AND ANAEROBIC BOTTLES    COMMENT                   -    GRAM STAIN                GRAM POSITIVE COCCI     ANTIBIOTIC                    ORG#1    CEFTRIAXONE                   S/0.064    ERYTHROMYCIN                  S/0.125    LEVOFLOXACIN                  S/0.50     OXACILLIN                     R          VANCOMYCIN                    S/0.25     PENICILLIN MIC                S/0.125  Organism 1   STREPTOCOCCUS PNEUMONIAE   Culture Comment   IN AEROBIC AND ANAEROBIC BOTTLES  Culture Comment . -  Gram Stain 1   GRAM POSITIVE COCCI    04-Feb-15 10:33, Influenza A + B Antigen ([facility])  Micro Text Report   INFLUENZA A+B ANTIGENS    COMMENT                   NEGATIVE FOR INFLUENZA A (ANTIGEN ABSENT)    COMMENT                   NEGATIVE FOR INFLUENZA B (ANTIGEN ABSENT)     ANTIBIOTIC  Comment 1..   NEGATIVE FOR INFLUENZA A (ANTIGEN ABSENT) A negative result does not exclude influenza.  Correlation with clinical impression is required.  Comment 2..   NEGATIVE FOR INFLUENZA  B (ANTIGEN ABSENT)   Result(s) reported on 06 Jul 2013 at 11:35AM.    04-Feb-15 16:37, Wound Aerobic/Anaerobic Culture  Micro Text Report   WOUND AER/ANAEROBIC CULT    COMMENT                   NO GROWTH AEROBICALLY/ANAEROBICALLY IN 4 DAYS    GRAM STAIN                MANY WHITE BLOOD CELLS    GRAM STAIN                MANY RED BLOOD CELLS    GRAM STAIN                MODERATE GRAM POSITIVE COCCI IN CHAINS    GRAM STAIN                RARE YEAST RARE GRAM POSITIVE ROD     ANTIBIOTIC  Specimen Source   right knee  Culture Comment   NO GROWTH AEROBICALLY/ANAEROBICALLY IN 4 DAYS  Gram Stain 1   MANY WHITE BLOOD CELLS  Gram Stain 2   MANY RED BLOOD CELLS  Gram Stain 3   MODERATE GRAM POSITIVE COCCI IN CHAINS  Gram Stain 4   RARE YEAST RARE GRAM POSITIVE ROD   Result(s) reported on 11 Jul 2013 at 10:17AM.    07-Feb-15 12:17, Blood Culture  Micro Text Report   BLOOD CULTURE    COMMENT                   NO GROWTH AEROBICALLY/ANAEROBICALLY IN 5 DAYS     ANTIBIOTIC  Specimen Source   #1 right hand  Culture Comment   NO GROWTH AEROBICALLY/ANAEROBICALLY IN 5 DAYS   Result(s) reported on 14 Jul 2013 at 12:00PM.    07-Feb-15 12:50, Blood Culture  Micro Text Report   BLOOD CULTURE    COMMENT                   NO GROWTH AEROBICALLY/ANAEROBICALLY IN 5 DAYS     ANTIBIOTIC  Specimen Source   line collection  Culture Comment   NO  GROWTH AEROBICALLY/ANAEROBICALLY IN 5 DAYS   Result(s) reported on 14 Jul 2013 at 12:00PM.    08-Feb-15 12:32, Clostridium Difficile  Micro Text Report   CLOSTRIDIUM DIFFICILE    C.DIFFICILE ANTIGEN       C.DIFFICILE GDH ANTIGEN : NEGATIVE    C.DIFFICILE TOXIN A/B     C.DIFFICILE TOXINS A AND B : NEGATIVE    INTERPRETATION            Negative for C. difficile.      ANTIBIOTIC    12-Feb-15 15:35, Urine Culture  Micro Text Report   URINE CULTURE    COMMENT                   NO GROWTH IN 36 HOURS     ANTIBIOTIC  Specimen Source   INDWELLING CATHETER  Culture Comment   NO GROWTH IN 36 HOURS   Result(s) reported on 16 Jul 2013 at 09:49AM.    12-Feb-15 15:45, Blood Culture  Micro Text Report   BLOOD CULTURE    COMMENT                   NO GROWTH IN 18-24 HOURS     ANTIBIOTIC  Culture Comment   NO GROWTH IN 18-24 HOURS   Result(s) reported on 15 Jul 2013 at 04:00PM.    12-Feb-15 15:51, Blood Culture  Micro Text Report   BLOOD CULTURE    COMMENT                   NO GROWTH IN 18-24 HOURS     ANTIBIOTIC  Culture Comment   NO GROWTH IN 18-24 HOURS   Result(s) reported on 15 Jul 2013 at 04:00PM.  Lab:    04-Feb-15 10:45, ABG and Lactic Acid  pH (ABG) 7.48  PCO2 25  PO2 59  FiO2 21  Base Excess -3.0  HCO3 18.6  O2 Saturation 96.4  O2 Device ra  Specimen Site (ABG)   RT RADIAL  Specimen Type (ABG) ARTERIAL  Patient Temp (ABG) 37.0  Result(s) reported on 06 Jul 2013 at 10:50AM.    04-Feb-15 15:35, ABG and Lactic Acid  pH (ABG) 7.39  PCO2 35  PO2 401  FiO2 100  Base Excess -3.1  HCO3 21.2  O2 Saturation 100.7  O2 Device 840  Specimen Site (ABG)   RT RADIAL  Specimen Type (ABG) ARTERIAL  Patient Temp (ABG) 37.0  Mode   ASSIST CONTROL  Vt 500  PEEP 5.0  Mechanical Rate 18    06-Feb-15 10:40, ABG and Lactic Acid  pH (ABG) 7.39  PCO2 44  PO2 79  FiO2 40  Base Excess 1.2  HCO3 26.6  O2 Saturation 97.0  O2 Device 840  Specimen Site (ABG)   RT RADIAL   Specimen Type (ABG) ARTERIAL  Mode   ASSIST CONTROL  Vt 500  PEEP 5.0  Mechanical Rate 18  Result(s) reported on 08 Jul 2013 at 10:48AM.    10-Feb-15 11:35, ABG  pH (ABG) 7.50  PCO2 41  PO2 64  FiO2 30  Base Excess 8.0  HCO3 32.0  O2 Saturation 95.2  O2 Device 840  Specimen Site (ABG)   RT RADIAL  Specimen Type (ABG) ARTERIAL  Patient Temp (ABG) 37.0  Mode   SPONTANEOUS  PSV 8  PEEP 5.0  Result(s) reported on 12 Jul 2013 at 11:43AM.  General Ref:    04-Feb-15 10:25, Acetaminophen, Serum  Acetaminophen, Serum < 2  10-30  POTENTIALLY TOXIC:   > 200 mcg/mL   > 50 mcg/mL at 12 hr after   ingestion   > 300 mcg/mL at 4 hr after   ingestion    81-OFB-51 02:58, Salicylates, Serum  Salicylates, Serum < 1.7  0.0-2.8  Therapeutic 2.8-20.0 mg/dL  Toxic >30.0 mg/dL    05-Feb-15 15:52, C-Reactive Protein  C-Reactive Protein   ========== TEST NAME ==========  ========= RESULTS =========  = REFERENCE RANGE =    C-REACTIVE PROTEIN,QUANT    C-Reactive Protein, Quant  C-Reactive Protein, Quant       [H  311.5 mg/L           ]           0.0-4.9  Results confirmed on  dilution.                  [facility]            No: [id number]            [address], [place], [place] [address]            Lindon Romp, MD         [id number]     Result(s) reported on 08 Jul 2013 at 05:54AM.    05-Feb-15 23:40, HIV Antibodies  HIV Antibodies   ==========  TEST NAME ==========  ========= RESULTS =========  = REFERENCE RANGE =    HIV 1/2 AB W/CONF WB    Panel [id number]  HIV 1/O/2 Abs-Index Value       [   <1.00                ]             <1.00  Index Value: Specimen reactivity relative to the negative cutoff.  HIV 1/O/2 Abs, Qual             [   Non Reactive         ]      Non Reactive                  [facility]            No: [id number]            [address] [place], [place] [address]            Lindon Romp, MD [phone]     Result(s)  reported on 08 Jul 2013 at [facility].  Cardiology:    04-Feb-15 11:44, ED ECG  Ventricular Rate 63  Atrial Rate 63  P-R Interval 144  QRS Duration 90  QT 418  QTc 427  P Axis 16  R Axis 46  T Axis 43  ECG interpretation   Sinus rhythm with Premature supraventricular complexes  Otherwise normal ECG  When compared with ECG of 06-Jul-2013 11:41,  Previous ECG has undetermined rhythm, needs review  ----------unconfirmed----------  Confirmed by OVERREAD, NOT ([id number]), editor PEARSON, BARBARA ([id number]) on 07/07/2013 8:22:37 AM  ED ECG     06-Feb-15 13:50, Echo Doppler  Echo Doppler   REASON FOR EXAM:      COMMENTS:       PROCEDURE: [place] - ECHO DOPPLER COMPLETE(TRANSTHOR)  - Jul 08 2013  1:50PM     RESULT: Echocardiogram Report    Patient Name:   Raymond Gibson Date of Exam: 07/08/2013  Medical Rec #:  [id number]             Custom1:  Date of Birth:  02-13-1961           Height:       74.0 in  Patient Age:    1 years           Weight:       250.0 lb  Patient Gender: M                  BSA:          2.39 m??    Indications: Endocarditis  Sonographer:    Sherrie Sport RDCS  Referring Phys: Abel Presto, J    Summary:   1. Left ventricular ejection fraction, by visual estimation, is 55 to   60%.   2. Normal global left ventricular systolic function.   3. Borderline concentric left ventricular hypertrophy.   4. Mildly increased leftventricular internal cavity size.   5. No evidence of cardiac mass or vegitation. No echocardiographic   evidence of infective endocarditis.  2D AND M-MODE MEASUREMENTS (normal ranges within parentheses):  Left Ventricle:          Normal  IVSd (2D):    0.93 cm (0.7-1.1)  LVPWd (2D):     1.19 cm (0.7-1.1) Aorta/LA:                  Normal  LVIDd (  2D):     5.40 cm (3.4-5.7) Aortic Root (2D): 3.60 cm (2.4-3.7)  LVIDs (2D):     3.91 cm           Left Atrium (2D): 3.60 cm (1.9-4.0)  LV FS (2D):     27.6 %   (>25%)  LV EF (2D):     53.1 %   (>50%)                                     Right Ventricle:                                    RVd (2D):        8.67 cm  LV DIASTOLIC FUNCTION:  MV Peak E: 0.88 m/s E/e' Ratio: 7.70  MV Peak A: 0.54 m/s Decel Time: 215 msec  E/A Ratio: 1.63  SPECTRAL DOPPLER ANALYSIS (where applicable):  Mitral Valve:  MV P1/2 Time: 62.35 msec  MV Area, PHT: 3.53 cm??  Aortic Valve: AoV Max Vel: 1.38 m/s AoV Peak PG: 7.6 mmHg AoV Mean PG:  LVOT Vmax: 0.86 m/s LVOT VTI:  LVOTDiameter: 2.20 cm  AoV Area, Vmax: 2.37 cm?? AoV Area, VTI:  AoV Area, Vmn:  Tricuspid Valve and PA/RV Systolic Pressure: TR Max Velocity: 2.20 m/s RA   Pressure: 10 mmHg RVSP/PASP: 29.3 mmHg  Pulmonic Valve:  PV Max Velocity: 1.10 m/s PV Max PG: 4.8 mmHg PV Mean PG:    PHYSICIAN INTERPRETATION:  Left Ventricle: The left ventricular internal cavity size was mildly   increased. Borderline concentric left ventricular hypertrophy. Global LV   systolic function was normal. Left ventricular ejection fraction, by   visual estimation, is 55 to 60%. Spectral Doppler shows normal pattern of   LV diastolic filling.  Right Ventricle: Normal right ventricular size, wall thickness, and   systolic function.  Left Atrium: The left atrium is normal in size and structure.  Right Atrium: The right atrium is normal in size and structure.  Pericardium: There is no evidence of pericardial effusion.  Mitral Valve: Structurally normal mitral valve, with normal leaflet   excursion; without any evidence of mitral stenosis or significant   regurgitation. No evidence of mitral valve stenosis. Trace mitral valve   regurgitation is seen. No MV vegetation was visualized.  Tricuspid Valve: The tricuspid valve is normal. Trivial tricuspid   regurgitation is visualized. The tricuspid regurgitant velocity is 2.20   m/s, and with an assumed right atrial pressure of 10 mmHg, the estimated   right ventricular systolic pressure is normal at 29.3 mmHg. No TV   vegetation was  visualized.  Aortic Valve: The aortic valve is trileaflet and structurally normal,   with normal leaflet excursion; without any evidence of aortic stenosis or   insufficiency. Trivial aortic valve regurgitation is seen.  Pulmonic Valve: The pulmonic valve is not well seen. No indication of     pulmonic valve regurgitation.  Aorta: The aortic root is normal in size and structure.  Venous: The inferior vena cava was normal.    [id number] Kathlyn Sacramento MD  Electronically signed by [id number] Kathlyn Sacramento MD  Signature Date/Time: 07/11/2013/7:26:36 AM    *** Final ***    IMPRESSION: .        Verified By: Mertie Clause. Fletcher Anon, M.D., MD  Routine Chem:    04-Feb-15  10:25, Ammonia, Plasma  Ammonia, Plasma 16  Result(s) reported on 06 Jul 2013 at 11:12AM.    04-Feb-15 10:25, Comprehensive Metabolic Panel  Glucose, Serum 189  BUN 16  Creatinine (comp) 1.16  Sodium, Serum 135  Potassium, Serum 2.9  Chloride, Serum 105  CO2, Serum 19  Calcium (Total), Serum 7.6  Osmolality (calc) 276  eGFR (African American) >60  eGFR (Non-African American) >60  eGFR values <36mL/min/1.73 m2 may be an indication of chronic  kidney disease (CKD).  Calculated eGFR is useful in patients with stable renal function.  The eGFR calculation will not be reliable in acutely ill patients  when serum creatinine is changing rapidly. It is not useful in   patients on dialysis. The eGFR calculation may not be applicable  to patients at the low and high extremes of body sizes, pregnant  women, and vegetarians.  Anion Gap 11    04-Feb-15 10:25, Ethanol, Serum  Ethanol, S. < 3  Ethanol % (comp) < 0.003  Result(s) reported on 06 Jul 2013 at 11:25AM.    04-Feb-15 10:33, Blood Culture  Result Comment   AEROBIC AND ANAEROBIC - NOTIFIED OF CRITICAL VALUE   - TO Rod Can, CCU AT 2311 07/06/13   - BY [facility].   - READ-BACK PROCESS PERFORMED.   Result(s) reported on 08 Jul 2013 at 08:47AM.  Result Comment   AEROBIC AND  ANAEROBIC - NOTIFIED OF CRITICAL VALUE   - TO Rod Can, CCU AT 2311 07/06/2013   - BY [facility].   - READ-BACK PROCESS PERFORMED.   Result(s) reported on 08 Jul 2013 at 08:46AM.    04-Feb-15 15:35, ABG and Lactic Acid  Result Comment   - NOTIFIED OF CRITICAL VALUE   - HAND DELIVERED   - 07/06/13,1550,Dr. Mariea Clonts   Result(s) reported on 06 Jul 2013 at 03:51PM.    05-Feb-15 49:44, Basic Metabolic Panel (w/Total Calcium)  Glucose, Serum 117  BUN 15  Creatinine (comp) 1.13  Sodium, Serum 138  Potassium, Serum 4.1  Chloride, Serum 107  CO2, Serum 26  Calcium (Total), Serum 8.8  Anion Gap 5  Osmolality (calc) 278  eGFR (African American) >60  eGFR (Non-African American) >60  eGFR values <30mL/min/1.73 m2 may be an indication of chronic  kidney disease (CKD).  Calculated eGFR is useful in patients with stable renal function.  The eGFR calculation will not be reliable in acutely ill patients  when serum creatinine is changing rapidly. It is not useful in   patients on dialysis. The eGFR calculation may not be applicable  to patients at the low and high extremes of body sizes, pregnant  women, and vegetarians.    05-Feb-15 04:15, CBC Profile  Result Comment   DIFFERENTIAL - AUTO DIFFERENTIAL IS CONSISTENT WITH   - MANUAL...MTV   Result(s) reported on 07 Jul 2013 at 06:57AM.    05-Feb-15 15:52, Magnesium, Serum  Magnesium, Serum 2.6  1.8-2.4  THERAPEUTIC RANGE: 4-7 mg/dL  TOXIC: > 10 mg/dL   -----------------------    06-Feb-15 96:75, Basic Metabolic Panel (w/Total Calcium)  Glucose, Serum 141  BUN 20  Creatinine (comp) 1.15  Sodium, Serum 137  Potassium, Serum 4.1  Chloride, Serum 107  CO2, Serum 28  Calcium (Total), Serum 8.5  Anion Gap 2  Osmolality (calc) 279  eGFR (African American) >60  eGFR (Non-African American) >60  eGFR values <4mL/min/1.73 m2 may be an indication of chronic  kidney disease (CKD).  Calculated eGFR is useful in patients with stable renal  function.  The eGFR calculation will not be reliable in acutely ill patients  when serum creatinine is changing rapidly. It is not useful in   patients on dialysis. The eGFR calculation may not be applicable  to patients at the low and high extremes of body sizes, pregnant  women, and vegetarians.    06-Feb-15 03:55, CBC Profile  Result Comment   AUTOMATED DIFFERENTIAL - AUTOMATED DIFFERENTIAL IS CONSISTENT.   - TPL   Result(s) reported on 08 Jul 2013 at 04:15AM.    06-Feb-15 03:55, Magnesium, Serum  Magnesium, Serum 2.5  1.8-2.4  THERAPEUTIC RANGE: 4-7 mg/dL  TOXIC: > 10 mg/dL   -----------------------    06-Feb-15 03:55, Phosphorus, Serum  Phosphorus, Serum 1.8  Result(s) reported on 08 Jul 2013 at 05:10AM.    06-Feb-15 03:55, Triglycerides, Serum  Triglycerides, Serum 256  Result(s) reported on 08 Jul 2013 at 05:10AM.    06-Feb-15 09:22, Vancomycin, Trough LAB  Result Comment   VANCOMYCIN TROUGH - This specimen was collected through an    - indwelling catheter or arterial line.   - A minimum of 22mls of blood was wasted prior     - to collecting the sample.  Interpret   - results with caution.   Result(s) reported on 08 Jul 2013 at 09:58AM.    07-Feb-15 80:88, Basic Metabolic Panel (w/Total Calcium)  Glucose, Serum 147  BUN 20  Creatinine (comp) 1.00  Sodium, Serum 141  Potassium, Serum 3.9  Chloride, Serum 108  CO2, Serum 32  Calcium (Total), Serum 8.6  Anion Gap 1  Osmolality (calc) 287  eGFR (African American) >60  eGFR (Non-African American) >60  eGFR values <75mL/min/1.73 m2 may be an indication of chronic  kidney disease (CKD).  Calculated eGFR is useful in patients with stable renal function.  The eGFR calculation will not be reliable in acutely ill patients  when serum creatinine is changing rapidly. It is not useful in   patients on dialysis. The eGFR calculation may not be applicable  to patients at the low and high extremes of body sizes,  pregnant  women, and vegetarians.  Result Comment   LABS - This specimen was collected through an    - indwelling catheter or arterial line.   - A minimum of 37mls of blood was wasted prior     - to collecting the sample.  Interpret   - results with caution.   Result(s) reported on 09 Jul 2013 at 04:23AM.    07-Feb-15 03:48, CBC Profile  Result Comment   DIFFERENTIAL - SMEAR SCANNED   Result(s) reported on 09 Jul 2013 at [facility]    07-Feb-15 03:48, Magnesium, Serum  Magnesium, Serum 2.3  1.8-2.4  THERAPEUTIC RANGE: 4-7 mg/dL  TOXIC: > 10 mg/dL   -----------------------    07-Feb-15 03:48, Phosphorus, Serum  Phosphorus, Serum 1.8  Result(s) reported on 09 Jul 2013 at 04:24AM.    07-Feb-15 11:30, Phosphorus, Serum  Phosphorus, Serum 2.6  Result(s) reported on 09 Jul 2013 at 11:49AM.    08-Feb-15 11:03, Basic Metabolic Panel (w/Total Calcium)  Glucose, Serum 126  BUN 20  Creatinine (comp) 1.05  Sodium, Serum 144  Potassium, Serum 3.7  Chloride, Serum 108  CO2, Serum 34  Calcium (Total), Serum 8.5  Anion Gap 2  Osmolality (calc) 291  eGFR (African American) >60  eGFR (Non-African American) >60  eGFR values <16mL/min/1.73 m2 may be an indication of chronic  kidney disease (CKD).  Calculated eGFR is useful in patients with stable renal function.  The eGFR calculation will not be reliable in acutely ill patients  when serum creatinine is changing rapidly. It is not useful in   patients on dialysis. The eGFR calculation may not be applicable  to patients at the low and high extremes of body sizes, pregnant  women, and vegetarians.  Result Comment   LABS - This specimen was collected through an    - indwelling catheter or arterial line.   - A minimum of 29mls of blood was wasted prior     - to collecting the sample.  Interpret   - results with caution.   Result(s) reported on 10 Jul 2013 at 04:25AM.    08-Feb-15 04:01, Magnesium, Serum  Magnesium, Serum 2.2   1.8-2.4  THERAPEUTIC RANGE: 4-7 mg/dL  TOXIC: > 10 mg/dL   -----------------------    08-Feb-15 04:01, Phosphorus, Serum  Phosphorus, Serum 2.5  Result(s) reported on 10 Jul 2013 at 04:27AM.    09-Feb-15 77:41, Basic Metabolic Panel (w/Total Calcium)  Glucose, Serum 165  BUN 26  Creatinine (comp) 0.89  Sodium, Serum 144  Potassium, Serum 3.9  Chloride, Serum 108  CO2, Serum 35  Calcium (Total), Serum 8.6  Anion Gap 1  Osmolality (calc) 295  eGFR (African American) >60  eGFR (Non-African American) >60  eGFR values <34mL/min/1.73 m2 may be an indication of chronic  kidney disease (CKD).  Calculated eGFR is useful in patients with stable renal function.  The eGFR calculation will not be reliable in acutely ill patients  when serum creatinine is changing rapidly. It is not useful in   patients on dialysis. The eGFR calculation may not be applicable  to patients at the low and high extremes of body sizes, pregnant  women, and vegetarians.  Result Comment   LABS - This specimen was collected through an    - indwelling catheter or arterial line.   - A minimum of 10mls of blood was wasted prior     - to collecting the sample.  Interpret   - results with caution.   Result(s) reported on 11 Jul 2013 at 04:41AM.    09-Feb-15 04:24, Magnesium, Serum  Magnesium, Serum 2.5  1.8-2.4  THERAPEUTIC RANGE: 4-7 mg/dL  TOXIC: > 10 mg/dL   -----------------------    09-Feb-15 04:24, Phosphorus, Serum  Phosphorus, Serum 3.0  Result(s) reported on 11 Jul 2013 at 04:43AM.    10-Feb-15 28:78, Basic Metabolic Panel (w/Total Calcium)  Glucose, Serum 162  BUN 29  Creatinine (comp) 0.95  Sodium, Serum 146  Potassium, Serum 4.1  Chloride, Serum 111  CO2, Serum 35  Calcium (Total), Serum 9.1  Anion Gap 0  Osmolality (calc) 300  eGFR (African American) >60  eGFR (Non-African American) >60  eGFR values <3mL/min/1.73 m2 may be an indication of chronic  kidney disease (CKD).  Calculated eGFR  is useful in patients with stable renal function.  The eGFR calculation will not be reliable in acutely ill patients  when serum creatinine is changing rapidly. It is not useful in   patients on dialysis. The eGFR calculation may not be applicable  to patients at the low and high extremes of body sizes, pregnant  women, and vegetarians.    10-Feb-15 04:41, Magnesium, Serum  Magnesium, Serum 2.3  1.8-2.4  THERAPEUTIC RANGE: 4-7 mg/dL  TOXIC: > 10 mg/dL   -----------------------    10-Feb-15 04:41, Phosphorus, Serum  Phosphorus, Serum 2.5  Result(s) reported on 12 Jul 2013 at 05:05AM.    11-Feb-15 67:67, Basic Metabolic Panel (  w/Total Calcium)  Glucose, Serum 116  BUN 25  Creatinine (comp) 0.89  Sodium, Serum 145  Potassium, Serum 3.9  Chloride, Serum 108  CO2, Serum 31  Calcium (Total), Serum 9.3  Anion Gap 6  Osmolality (calc) 294  eGFR (African American) >60  eGFR (Non-African American) >60  eGFR values <37mL/min/1.73 m2 may be an indication of chronic  kidney disease (CKD).  Calculated eGFR is useful in patients with stable renal function.  The eGFR calculation will not be reliable in acutely ill patients  when serum creatinine is changing rapidly. It is not useful in   patients on dialysis. The eGFR calculation may not be applicable  to patients at the low and high extremes of body sizes, pregnant  women, and vegetarians.    11-Feb-15 04:51, CBC Profile  Result Comment   labs - This specimen was collected through an    - indwelling catheter or arterial line.   - A minimum of 51mls of blood was wasted prior     - to collecting the sample.  Interpret   - results with caution.  MONOCYTES - CONSISTENT WITH PREVIOUS RESULTS.   Result(s) reported on 13 Jul 2013 at 05:31AM.    12-Feb-15 05:17, CBC Profile  Result Comment   LABS - This specimen was collected through an    - indwelling catheter or arterial line.   - A minimum of 79mls of blood was wasted prior     - to  collecting the sample.  Interpret   - results with caution.  MONOCYTES - CONSISTENT WITH PREVIOUS RESULTS.   Result(s) reported on 14 Jul 2013 at 06:10AM.    14-Feb-15 24:26, Basic Metabolic Panel (w/Total Calcium)  Glucose, Serum 163  BUN 20  Creatinine (comp) 1.05  Sodium, Serum 131  Potassium, Serum 4.3  Chloride, Serum 98  CO2, Serum 26  Calcium (Total), Serum 9.4  Anion Gap 7  Osmolality (calc) 269  eGFR (African American) >60  eGFR (Non-African American) >60  eGFR values <46mL/min/1.73 m2 may be an indication of chronic  kidney disease (CKD).  Calculated eGFR is useful in patients with stable renal function.  The eGFR calculation will not be reliable in acutely ill patients  when serum creatinine is changing rapidly. It is not useful in   patients on dialysis. The eGFR calculation may not be applicable  to patients at the low and high extremes of body sizes, pregnant  women, and vegetarians.    14-Feb-15 10:18, CBC Profile  Result Comment   PLATELETS - SLIGHT PLATELET CLUMPING IN SPECIMEN. ACTUAL   - NUMERICAL COUNT MAY BE SOMEWHAT HIGHER THAN   - THE REPORTED VALUE.   Result(s) reported on 16 Jul 2013 at 11:01AM.  Urine Drugs:    04-Feb-15 10:25, Urine Drug Screen, Qual  Tricyclic Antidepressant, Ur Qual (comp) NEGATIVE  Result(s) reported on 06 Jul 2013 at 11:54AM.  Amphetamines, Urine Qual. NEGATIVE  MDMA, Urine Qual. NEGATIVE  Cocaine Metabolite, Urine Qual. NEGATIVE  Opiate, Urine qual NEGATIVE  Phencyclidine, Urine Qual. NEGATIVE  Cannabinoid, Urine Qual. POSITIVE  Barbiturates, Urine Qual. NEGATIVE  Benzodiazepine, Urine Qual. NEGATIVE  -----------------  The URINE DRUG SCREEN provides only a preliminary, unconfirmed  analytical test result and should not be used for non-medical   purposes.  Clinical consideration and professional judgment should be   applied to any positive drug screen result due to possible  interfering substances.  A more specific  alternate chemical method  must be used in order to  obtain a confirmed analytical result.  Gas  chromatography/mass spectrometry (GC/MS) is the preferred  confirmatory method.  Methadone, Urine Qual. NEGATIVE  Cardiac:    04-Feb-15 10:25, Cardiac Panel  CK, Total 633  [phone]  NOTE: NEW REFERENCE RANGE   07/04/2013  CPK-MB, Serum 6.3  Result(s) reported on 06 Jul 2013 at 11:25AM.    04-Feb-15 10:25, Troponin I  Troponin I < 0.02  0.00-0.05  0.05 ng/mL or less: NEGATIVE   Repeat testing in 3-6 hrs   if clinically indicated.  >0.05 ng/mL: POTENTIAL   MYOCARDIAL INJURY. Repeat   testing in 3-6 hrs if   clinically indicated.  NOTE: An increase or decrease   of 30% or more on serial   testing suggests a   clinically important change  Routine UA:    04-Feb-15 10:25, Urinalysis  Color (UA) Amber  Clarity (UA) Clear  Glucose (UA)   100 mg/dL  Bilirubin (UA) Negative  Ketones (UA) 2+  Specific Gravity (UA) 1.015  Blood (UA) 3+  pH (UA) 6.0  Protein (UA) 75 mg/dL  Nitrite (UA) Negative  Leukocyte Esterase (UA) Negative  Result(s) reported on 06 Jul 2013 at 11:12AM.  RBC (UA) 58 /HPF  WBC (UA) 2 /HPF  Bacteria (UA)   NONE SEEN  Epithelial Cells (UA) <1 /HPF  Mucous (UA) PRESENT  Result(s) reported on 06 Jul 2013 at 11:12AM.    12-Feb-15 15:35, Urinalysis  Color (UA) Amber  Clarity (UA) Clear  Glucose (UA) Negative  Bilirubin (UA) Negative  Ketones (UA) Negative  Specific Gravity (UA) 1.015  Blood (UA) 2+  pH (UA) 7.0  Protein (UA) 30 mg/dL  Nitrite (UA) Negative  Leukocyte Esterase (UA) Trace  Result(s) reported on 14 Jul 2013 at 04:11PM.  RBC (UA) 37 /HPF  WBC (UA) 26 /HPF  Bacteria (UA) TRACE  Epithelial Cells (UA)   NONE SEEN  Mucous (UA) PRESENT  Result(s) reported on 14 Jul 2013 at 04:11PM.  Routine Sero:    04-Feb-15 10:25, Rapid HIV 1/2 Ab Test with Confirmation ([facility])  Rapid HIV 1/2 Ab Test with Confirmation ([facility])   NEG - HIV 1/2 AB This is a  screening test for the presence of HIV-1/2 antibodies.  False-negative and false-positive results can occur.  All preliminary positive samples are sent for confirmatory testing.  Clinical correlation is necessary to assess whether repeat testing  may be needed for negative results.  Routine Coag:    04-Feb-15 10:25, Activated PTT  Activated PTT (APTT) 29.8  A HCT value >55% may artifactually increase the APTT. In one study,  the increase was an average of 19%.  Reference: "Effect on Routine and Special Coagulation Testing Values  of Citrate Anticoagulant Adjustment in Patients with High HCT Values."  American Journal of Clinical Pathology 2006;126:400-405.    04-Feb-15 10:25, Prothrombin Time  Prothrombin 13.5  INR 1.0  INR reference interval applies to patients on anticoagulant therapy.  A single INR therapeutic range for coumarins is not optimal for all  indications; however, the suggested range for most indications is  2.0 - 3.0.  Exceptions to the INR Reference Range may include: Prosthetic heart  valves, acute myocardial infarction, prevention of myocardial  infarction, and combinations of aspirin and anticoagulant. The need  for a higher or lower target INR must be assessed individually.  Reference: The Pharmacology and Management of the Vitamin K   antagonists: the seventh ACCP Conference on Antithrombotic and  Thrombolytic Therapy. [id number].[id number] Sept:126 (3suppl): [id number].  A HCT value >55%  may artifactually increase the PT.  In one study,   the increase was an average of 25%.  Reference:  "Effect on Routine and Special Coagulation Testing Values  of Citrate Anticoagulant Adjustment in Patients with High HCT Values."  American Journal of Clinical Pathology 2006;126:400-405.  Routine Hem:    04-Feb-15 10:25, CBC Profile  WBC (CBC) 23.4  RBC (CBC) 4.65  Hemoglobin (CBC) 15.0  Hematocrit (CBC) 44.3  Platelet Count (CBC) 144  MCV 95  MCH 32.2  MCHC 33.8  RDW 13.6   Neutrophil % 90.5  Lymphocyte % 2.1  Monocyte % 6.5  Eosinophil % 0.7  Basophil % 0.2  Neutrophil # 21.2  Lymphocyte # 0.5  Monocyte # 1.5  Eosinophil # 0.2  Basophil # 0.0  Result(s) reported on 06 Jul 2013 at 10:47AM.    05-Feb-15 04:15, CBC Profile  WBC (CBC) 25.0  RBC (CBC) 4.23  Hemoglobin (CBC) 13.7  Hematocrit (CBC) 40.5  Platelet Count (CBC) 125  MCV 96  MCH 32.3  MCHC 33.8  RDW 13.8  Neutrophil % 86.9  Lymphocyte % 3.0  Monocyte % 9.8  Eosinophil % 0.2  Basophil % 0.1  Neutrophil # 21.7  Lymphocyte # 0.8  Monocyte # 2.5  Eosinophil # 0.1  Basophil # 0.0    06-Feb-15 03:55, CBC Profile  WBC (CBC) 24.9  RBC (CBC) 3.98  Hemoglobin (CBC) 13.0  Hematocrit (CBC) 38.1  Platelet Count (CBC) 122  MCV 96  MCH 32.6  MCHC 34.0  RDW 13.9  Neutrophil % 84.4  Lymphocyte % 4.4  Monocyte % 11.1  Eosinophil % 0.0  Basophil % 0.1  Neutrophil # 21.0  Lymphocyte # 1.1  Monocyte # 2.8  Eosinophil # 0.0  Basophil # 0.0    07-Feb-15 03:48, CBC Profile  WBC (CBC) 18.4  RBC (CBC) 3.94  Hemoglobin (CBC) 12.8  Hematocrit (CBC) 37.4  Platelet Count (CBC) 161  MCV 95  MCH 32.6  MCHC 34.3  RDW 14.0  Neutrophil % 78.5  Lymphocyte % 7.7  Monocyte % 13.4  Eosinophil % 0.1  Basophil % 0.3  Neutrophil # 14.5  Lymphocyte # 1.4  Monocyte # 2.5  Eosinophil # 0.0  Basophil # 0.1    08-Feb-15 04:01, CBC Profile  WBC (CBC) 19.9  RBC (CBC) 4.19  Hemoglobin (CBC) 13.2  Hematocrit (CBC) 40.0  Platelet Count (CBC) 233  Result(s) reported on 10 Jul 2013 at 04:43AM.  MCV 96  MCH 31.5  MCHC 33.0  RDW 14.0  Bands 2  Segmented Neutrophils 68  Lymphocytes 10  Variant Lymphocytes 7  Monocytes 13  Diff Comment 1   TOXIC GRANULATION  Diff Comment 2   PLTS VARIED IN SIZE  Diff Comment 3   RBCs APPEAR NORMAL   Result(s) reported on 10 Jul 2013 at 04:43AM.    09-Feb-15 04:24, CBC Profile  WBC (CBC) 21.0  RBC (CBC) 4.19  Hemoglobin (CBC) 13.2  Hematocrit (CBC) 40.5   Platelet Count (CBC) 296  Result(s) reported on 11 Jul 2013 at 05:54AM.  MCV 97  MCH 31.5  MCHC 32.5  RDW 14.2  Bands 1  Segmented Neutrophils 74  Lymphocytes 12  Variant Lymphocytes 4  Monocytes 5  Metamyelocyte 2  Myelocyte 2  NRBC 1  Diff Comment 1   PLTS VARIED IN SIZE  Diff Comment 2   RBCs APPEAR NORMAL  Diff Comment 3   TOXIC GRANULATION   Result(s) reported on 11 Jul 2013 at 05:54AM.    11-Feb-15  04:51, CBC Profile  WBC (CBC) 18.3  RBC (CBC) 4.06  Hemoglobin (CBC) 13.3  Hematocrit (CBC) 39.2  Platelet Count (CBC) 415  MCV 97  MCH 32.7  MCHC 33.9  RDW 14.4  Neutrophil % 73.3  Lymphocyte % 12.5  Monocyte % 13.7  Eosinophil % 0.1  Basophil % 0.4  Neutrophil # 13.4  Lymphocyte # 2.3  Monocyte # 2.5  Eosinophil # 0.0  Basophil # 0.1    12-Feb-15 05:17, CBC Profile  WBC (CBC) 18.0  RBC (CBC) 4.05  Hemoglobin (CBC) 13.2  Hematocrit (CBC) 39.2  Platelet Count (CBC) 420  MCV 97  MCH 32.6  MCHC 33.8  RDW 14.2  Neutrophil % 72.4  Lymphocyte % 11.1  Monocyte % 16.1  Eosinophil % 0.2  Basophil % 0.2  Neutrophil # 13.0  Lymphocyte # 2.0  Monocyte # 2.9  Eosinophil # 0.0  Basophil # 0.0    14-Feb-15 10:18, CBC Profile  WBC (CBC) 18.8  RBC (CBC) 4.09  Hemoglobin (CBC) 13.3  Hematocrit (CBC) 39.4  Platelet Count (CBC) 450  MCV 96  MCH 32.5  MCHC 33.7  RDW 14.0  Segmented Neutrophils 79  Lymphocytes 9  Monocytes 11  Basophil 1  Diff Comment 1   LARGE PLATELETS  Diff Comment 2   PLTS VARIED IN SIZE  Diff Comment 3   RBCs APPEAR NORMAL   Result(s) reported on 16 Jul 2013 at 11:01AM.   RADIOLOGY:  Radiology Results: XRay:    04-Feb-15 10:50, Chest Portable Single View  Chest Portable Single View  REASON FOR EXAM:    ams  COMMENTS:       PROCEDURE: DXR - DXR PORTABLE CHEST SINGLE VIEW  - Jul 06 2013 10:50AM     CLINICAL DATA:  Weakness and altered mental status.    EXAM:  PORTABLE CHEST - 1 VIEW    COMPARISON:   None.    FINDINGS:  1043 hrs. Low volume film without edema or focal airspace  consolidation. Mild vascular congestion without pulmonary edema.  Cardiopericardial silhouette is borderline increased and likely  accentuated by the low volumes. Telemetry leads overlie the chest.     IMPRESSION:  Low volume film with mild vascular congestion.      Electronically Signed    By: Misty Stanley M.D.    On: 07/06/2013 11:00         Verified By: ERIC A. MANSELL, M.D.,    04-Feb-15 13:37, Chest Portable Single View  Chest Portable Single View  REASON FOR EXAM:    post intubation  COMMENTS:       PROCEDURE: DXR - DXR PORTABLE CHEST SINGLE VIEW  - Jul 06 2013  1:37PM     CLINICAL DATA:  Status post intubation    EXAM:  PORTABLE CHEST - 1 VIEW    COMPARISON:  07/06/2013 1042 hrs    FINDINGS:  Cardiac shadow is stable. An endotracheal tube is been placed in  lies 5 cm above the carina. The lungs are well aerated bilaterally.  No acute bony abnormality is seen.     IMPRESSION:  Endotracheal tube as described.  No acute abnormality is noted.      Electronically Signed    By: Inez Catalina M.D.    On: 07/06/2013 13:37         Verified By: Everlene Farrier, M.D.,    04-Feb-15 17:12, Chest Portable Single View  Chest Portable Single View  REASON FOR EXAM:  central line placement  COMMENTS:       PROCEDURE: DXR - DXR PORTABLE CHEST SINGLE VIEW  - Jul 06 2013  5:12PM     CLINICAL DATA:  Central line placement    EXAM:  PORTABLE CHEST - 1 VIEW    COMPARISON:  Earlier film of the same day    FINDINGS:  Left IJ central line has been placed to the innominate vein. No  pneumothorax. Endotracheal tube stable. .  Some increase in pulmonary vascular congestion. No definite  effusion.  Heart size upper limits normal. Tortuous aorta. Regional bones  unremarkable.     IMPRESSION:  Left IJ central line to the innominate vein, no pneumothorax.      Electronically Signed    By:  Oley Balm M.D.    On: 07/06/2013 17:24         Verified By: Philis Fendt, M.D.,    05-Feb-15 08:01, Knee Right AP and Lateral  Knee Right AP and Lateral  REASON FOR EXAM:    septic knee  COMMENTS:   LMP: (Male)    PROCEDURE: DXR - DXR KNEE RIGHT AP AND LATERAL  - Jul 07 2013  8:01AM     CLINICAL DATA:  Septic knee.    EXAM:  RIGHT KNEE - 1-2 VIEW    COMPARISON:  No priors.    FINDINGS:  Two views of the right knee demonstrate no definite acute displaced  fracture, subluxation or dislocation. No gross areas of bony  osteolysis are appreciated. Joint space narrowing, subchondral  sclerosis, subchondral cyst formation and osteophyte formation are  noted in a tricompartmental distribution, compatible with  osteoarthritis.     IMPRESSION:  1. No acute radiographic abnormality of the right knee.  2. Tricompartmental osteoarthritis.      Electronically Signed    By: Trudie Reed M.D.    On: 07/07/2013 08:02       Verified By: Florencia Reasons, M.D.,    06-Feb-15 09:55, Chest Portable Single View  Chest Portable Single View  REASON FOR EXAM:    pt on vent with dimished left breath sounds  COMMENTS:       PROCEDURE: DXR - DXR PORTABLE CHEST SINGLE VIEW  - Jul 08 2013  9:55AM     CLINICAL DATA:  Ventilator patient.  Diminished left breath sounds.    EXAM:  PORTABLE CHEST -1 VIEW    COMPARISON:  DG CHEST 1V PORT dated 07/06/2013; DG CHEST 1V PORT dated  07/06/2013    FINDINGS:  0944 hr. Endotracheal tube and left IJ central venous catheter  appear unchanged. A nasogastric tube has been placed, projecting  below the diaphragm. There are increased left greater than right  basilar opacities which probably represent atelectasis. A small  amount of pleural fluid on the left is suspected. There is no overt  pulmonary edema or pneumothorax. The heart size is stable.     IMPRESSION:  Interval increased left-greater-than-right basilar opacities and  probable  small left pleural effusion. No pneumothorax.      Electronically Signed    By: Roxy Horseman M.D.    On: 07/08/2013 10:38     Verified By: Gerrianne Scale, M.D.,    08-Feb-15 09:31, Chest Portable Single View  Chest Portable Single View  REASON FOR EXAM:    fever  COMMENTS:       PROCEDURE: DXR - DXR PORTABLE CHEST SINGLE VIEW  - Jul 10 2013  9:31AM  CLINICAL DATA:  Fever    EXAM:  PORTABLE CHEST - 1 VIEW    COMPARISON:  07/08/2013    FINDINGS:  Cardiac shadow is stable. A left central venous line is again seen  in the innominate vein. An endotracheal tube is seen 6 cm above the  carina. A nasogastric catheter is also noted extending towards the  stomach. The lungs are clear bilaterally.     IMPRESSION:  No acute abnormality noted.      Electronically Signed    By: Inez Catalina M.D.    On: 07/10/2013 10:42         Verified By: Everlene Farrier, M.D.,    14-Feb-15 10:48, Knee Right Complete  Knee Right Complete  REASON FOR EXAM:    swelling and pain  COMMENTS:       PROCEDURE: DXR - DXR KNEE RT COMP WITH OBLIQUES  - Jul 16 2013 10:48AM     CLINICAL DATA:  Septic knee, recent right knee aspiration    EXAM:  RIGHT KNEE - COMPLETE 4+ VIEW    COMPARISON:  DG KNEE 1-2V*R* dated 07/07/2013    FINDINGS:  The lateral radiograph is degraded due to obliquity.  No fracture or dislocation. Moderate to severe tricompartmental  degenerative change, worst within the lateral compartment with joint  space loss, subchondralsclerosis and osteophytosis. Evaluation for  joint effusion is degraded due to obliquity on the provided lateral  radiograph though an at least small right-sided joint effusion is  suspected. There is enthesopathic change of the tibial tuberosity.  Potential mild diffuse soft tissue swelling about the knee.    No discrete area of osteolysis to suggest osteomyelitis. No  radiopaque foreign body.     IMPRESSION:  1. Mild apparent diffuse soft tissue  swelling about the knee with  suspected small joint effusion, suboptimally evaluated.  2. No discrete area of osteolysis to suggest osteomyelitis. Further  evaluation could be performed with knee MRI as clinically indicated.      Electronically Signed    By: Sandi Mariscal M.D.    On: 07/16/2013 12:23         Verified By: Aileen Fass, M.D.,  [place]:    13-Feb-15 10:44, [place] Color Doppler Upper Extrem Bilat (Arms)  [place] Color Doppler Upper Extrem Bilat (Arms)  REASON FOR EXAM:    LIJ thrombosis, sepsis, ongoing fevers, r/o DVT  COMMENTS:       PROCEDURE: [place]  - US DOPPLER UP EXTR  BILATERAL  - Jul 15 2013 10:44AM     CLINICAL DATA:  Left internal jugular thrombosis, sepsis, ongoing  fever, question DVT    EXAM:  BILATERAL UPPER EXTREMITY VENOUS DOPPLER ULTRASOUND    TECHNIQUE:  Gray-scale sonography with graded compression, as well as color  Doppler and duplex ultrasound were performed to evaluate the upper  extremity deep venous system from the level of the subclavian vein  and including the jugular, axillary, basilic and upper cephalic  vein. Spectral Doppler was utilized to evaluate flow at rest and  with distal augmentation maneuvers.    COMPARISON:  None    FINDINGS:  Thrombus within deep veins: Thrombus within right cephalic vein. No  additional thrombus.    Compressibility of deep veins: Noncompressible right cephalic vein.  Unable to compress left internal jugular vein due to presence of  catheter. Remaining deep venous systems compressible.    Duplex waveform respiratory phasicity: No flow right cephalic vein,  unable to evaluate.    Duplex  waveform response to augmentation: No flow right cephalic  vein, unable to evaluate    Venous reflux:  None visualized.    Other findings: Sluggish/Rouleaux flow within right axillary and  subclavian veins.     IMPRESSION:  Thrombosis of the right cephalic vein.    Rouleaux flow within the right subclavian and axillary  veins  indicating slow flow though these do not appear thrombosed at this  time.    Limited assessment of left internal jugular vein due to catheter.    No additional deep venous thrombosis identified.      Electronically Signed    By: Lavonia Dana M.D.    On: 07/15/2013 10:56         Verified By: Burnetta Sabin, M.D.,  LabUnknown:    04-Feb-15 10:50, Chest Portable Single View  PACS Image    04-Feb-15 11:32, CT Head Without Contrast  PACS Image    04-Feb-15 13:37, Chest Portable Single View  PACS Image    04-Feb-15 15:14, CT Head With Contrast  PACS Image    04-Feb-15 17:12, Chest Portable Single View  PACS Image    05-Feb-15 08:01, Knee Right AP and Lateral  PACS Image    06-Feb-15 09:55, Chest Portable Single View  PACS Image    06-Feb-15 15:12, CT Head Without Contrast  PACS Image    08-Feb-15 09:31, Chest Portable Single View  PACS Image    10-Feb-15 14:36, CT Cervical Spine Without Contrast  PACS Image    12-Feb-15 18:31, MRI Neck WWO (Soft Tissue)  PACS Image    12-Feb-15 18:32, MRI Brain  With/Without Contrast  PACS Image    13-Feb-15 10:44, [place] Color Doppler Upper Extrem Bilat (Arms)  PACS Image    14-Feb-15 10:48, Knee Right Complete  PACS Image  MRI:    12-Feb-15 18:31, MRI Neck WWO (Soft Tissue)  MRI Neck WWO (Soft Tissue)  REASON FOR EXAM:    pain  COMMENTS:       PROCEDURE: MR  - MR NECK WO/W CONT SOFT TISSUE  - Jul 14 2013  6:31PM     CLINICAL DATA:  Dizziness, evaluate for abscess or infection.    EXAM:  MRI HEAD WITH CONTRAST    MRI NECK WITH CONTRAST    TECHNIQUE:  Multiplanar, multiecho pulse sequences of the brain and surrounding  structures, and neck, to include the craniocervical junction and  cervicothoracic junction, were obtained with intravenous contrast.  Please note, examination was started on July 13, 2013, as  patient could not tolerate further imaging, patient returned on  July 14, 2013 to complete the  examination.    CONTRAST:  20 cc MultiHance    COMPARISON:  CT CERVICAL SPINE W/O CM dated 07/12/2013    FINDINGS:  MRI HEAD FINDINGS    Layering foci of reduced diffusion seen within the right greater  left occipital horns. There is corresponding punctate focus of T1  hyperintense signal and superimposed minimal enhancement. No  abnormal meningeal enhancement suggested at the frontalconvexities,  axial 21 and 22 of 26. No hydrocephalus. The ventricles and sulci  are overall normal for patient's age.    No abnormal parenchymal enhancement, though the degree of motion may  decrease sensitivity for subtle parenchymal signal abnormality. No  reduced diffusion to suggest acute ischemia. No susceptibility  artifact to suggest hemorrhage.    Normal major intracranial vascular flow voids seen at the skull base  with rib os bilateral posterior communicating arteries present. No  paranasal sinus  air-fluid levels. Ocular globes and orbital contents  are nonsuspicious though not tailored for evaluation. Mastoid air  cells appear well-aerated. No abnormal sellar expansion. No  cerebellar tonsillar descent below the foramen magnum.  MRI NECK FINDINGS    Markedly motion degraded examination, the postcontrast sequences are  nearly nondiagnostic.    Abnormal signal within the left internal jugular vein concerning for  thrombosis though, motion degrades evaluation. No definite fluid  collections in the neck. Suspected acute discogenic endplate change  at S8-2, to a lesser extent at C4-5 though motion degrades  evaluation and this is not tailored for cervical spine. Limited  assessment for evaluation of the major salivary glands,  aerodigestive tract due to motion. However, the airway appears  patent.     IMPRESSION:  MRI brain: Abnormal signal and enhancement dependent within the  ventricles in addition to abnormal meningeal enhancement at the  frontal convexities, constellation of findings  highly concerning for  meningitis and debris within the ventricles. No intracranial  abscess.    MRI of the neck: Markedly motion degraded examination, nearly  nondiagnostic. Abnormal signal within the left external jugular vein  concerning for thrombus. No definite abscess.    Findings discussed with and reconfirmed by Dr. Trisha Mangle on July 14, 2013 at 7:15 p.m.      Electronically Signed    By: Elon Alas    On: 07/14/2013 19:17         Verified By: Ricky Ala, M.D.,    12-Feb-15 18:32, MRI Brain  With/Without Contrast  MRI Brain  With/Without Contrast  REASON FOR EXAM:    pain  COMMENTS:       PROCEDURE: MR  - MR BRAIN WO/W CONTRAST  - Jul 14 2013  6:32PM     CLINICAL DATA:  Dizziness, evaluate for abscess or infection.    EXAM:  MRI HEAD WITH CONTRAST    MRI NECK WITH CONTRAST    TECHNIQUE:  Multiplanar, multiecho pulse sequences of the brain and surrounding  structures, and neck, to include the craniocervical junction and  cervicothoracic junction, were obtained with intravenous contrast.  Please note, examination was started on July 13, 2013, as  patient could not tolerate further imaging, patient returned on  July 14, 2013 to complete the examination.    CONTRAST:  20 cc MultiHance    COMPARISON:  CT CERVICAL SPINE W/O CM dated 07/12/2013    FINDINGS:  MRI HEAD FINDINGS    Layering foci of reduced diffusion seen within the right greater  left occipital horns. There is corresponding punctate focus of T1  hyperintense signal and superimposed minimal enhancement. No  abnormal meningeal enhancement suggested at the frontal convexities,  axial 21 and 22 of 26. No hydrocephalus. The ventricles and sulci  are overall normal for patient's age.    No abnormal parenchymal enhancement, though the degree of motion may  decrease sensitivity for subtle parenchymal signal abnormality. No  reduced diffusion to suggest acute ischemia. No  susceptibility  artifact to suggest hemorrhage.    Normal major intracranial vascular flow voids seen at the skull base  with rib os bilateral posterior communicating arteries present. No  paranasal sinus air-fluid levels. Ocular globes and orbital contents  are nonsuspicious though not tailored for evaluation. Mastoid air  cells appear well-aerated. No abnormal sellar expansion. No  cerebellar tonsillar descent below the foramen magnum.  MRI NECKFINDINGS    Markedly motion degraded examination, the postcontrast sequences are  nearly nondiagnostic.  Abnormal signal within the left internal jugular vein concerning for  thrombosis though, motion degrades evaluation. No definite fluid  collections in the neck. Suspected acute discogenic endplate change  at X4-5, to a lesser extent at C4-5 though motion degrades  evaluation and this is not tailored for cervical spine. Limited  assessment for evaluation of the major salivary glands,  aerodigestive tract due to motion. However, the airway appears  patent.     IMPRESSION:  MRI brain: Abnormal signal and enhancement dependent within the  ventricles in addition to abnormal meningeal enhancement at the  frontal convexities, constellation of findings highly concerning for  meningitis and debris within the ventricles. No intracranial  abscess.    MRI of the neck: Markedly motion degraded examination, nearly  nondiagnostic. Abnormal signal within the left external jugular vein  concerning for thrombus. No definite abscess.    Findings discussed with and reconfirmed by Dr. Trisha Mangle on July 14, 2013 at 7:15 p.m.      Electronically Signed    By: Elon Alas    On: 07/14/2013 19:17         Verified By: Ricky Ala, M.D.,  CT:    04-Feb-15 11:32, CT Head Without Contrast  CT Head Without Contrast  REASON FOR EXAM:    ams  COMMENTS:       PROCEDURE: CT  - CT HEAD WITHOUT CONTRAST  - Feb  4 201511:32AM      CLINICAL DATA:  Altered mental status    EXAM:  CT HEAD WITHOUT CONTRAST    TECHNIQUE:  Contiguous axial images were obtained from the base of the skull  through the vertex without intravenous contrast.    COMPARISON:  None.  FINDINGS:  The brainstem and cerebellum appear normal. One could question mild  swelling of the cerebral hemispheres. The ventricles appear slightly  out of proportion to the sulci. I question low density in the right  parieto-occipital region. This is not definite. No side of  intracranial hemorrhage. No skull fracture. No fluid in the sinuses,  middle ears or mastoids.     IMPRESSION:  Question swelling of the cerebral hemispheres. Ventricles are more  prominent than the sulci.    Question low density in the right parieto-occipital region that  could represent an area of infarction. This is not definite.  Electronically Signed    By: Nelson Chimes M.D.    On: 07/06/2013 11:36         Verified By: Jules Schick, M.D.,    04-Feb-15 15:14, CT Head With Contrast  CT Head With Contrast  REASON FOR EXAM:    Meningitis. Cerebral edema/?? CVA.  Cannot do MRI as   pt. is Intubated.  COMMENTS:       PROCEDURE: CT  - CT HEAD WITH CONTRAST  - Jul 06 2013  3:14PM     CLINICAL DATA:  Meningitis.  Stroke.  Cerebral edema.    EXAM:  CT HEAD WITH CONTRAST    TECHNIQUE:  Contiguous axial images were obtained from the base of the skull  through the vertex with intravenous contrast.  CONTRAST:  75 cc [id number]    COMPARISON:  Earlier same day    FINDINGS:  Again the brain in general appears swollen with absence of visible  sulci. Ventricles are slightly prominent. There is continued  question of a small area of low density in the right  parieto-occipital region that could represent a small focal  infarction. No shift.  No focal enhancing lesion.     IMPRESSION:  No focal enhancing lesion. Likely generalized brain swelling. Is  some prominence of  the ventricular system could represent early  communicating hydrocephalus.  Continued question of a small area of low density in the right  parietal occipital brain that could represent an area of focal  infarction. This is not definite.      Electronically Signed    By: Nelson Chimes M.D.    On: 07/06/2013 15:16         Verified By: Jules Schick, M.D.,    06-Feb-15 15:12, CT Head Without Contrast  CT Head Without Contrast  REASON FOR EXAM:    AMS, edema  COMMENTS:       PROCEDURE: CT  - CT HEAD WITHOUT CONTRAST  - Jul 08 2013  3:12PM     CLINICAL DATA:  Altered mental status.    EXAM:  CT HEAD WITHOUT CONTRAST    TECHNIQUE:  Contiguous axial images were obtained from the base of the skull  through the vertex without intravenous contrast.    COMPARISON:  07/06/2013  FINDINGS:  Ventricle size has returned to normal. Cerebral edema also has  resolved. The sulci now appear normal.    Negative for hemorrhage, mass, or infarct. The questionable area of  hypodensity in the right occipital parietal lobe is no longer  present. This may have been artifactual however consideration could  be given to reversible process such as hypertensive encephalopathy.  Cerebritis/meningitis also a consideration.    No acute bony changes.  Mild mucosal edema in the paranasal sinuses.     IMPRESSION:  The brain now has a normal appearance with resolution of acute  hydrocephalus and possible cerebral edema. Differential diagnosis  includes reversible changes of hypertensive encephalopathy as well  as meningeal encephalitis. Correlate with LP and CSF analysis.      Electronically Signed    By: Franchot Gallo M.D.    On: 07/08/2013 15:28         Verified By: Truett Perna, M.D.,    10-Feb-15 14:36, CT Cervical Spine Without Contrast  CT Cervical Spine Without Contrast  REASON FOR EXAM:    pain  COMMENTS:       PROCEDURE: CT  - CT CERVICAL SPINE WO  - Jul 12 2013  2:36PM      CLINICAL DATA:  Patient unable to move his neck.  Neck pain.    EXAM:  CT CERVICAL SPINE WITHOUT CONTRAST    TECHNIQUE:  Multidetector CT imaging of the cervical spine was performed without  intravenous contrast. Multiplanar CT image reconstructions were also  generated.  COMPARISON:  None.    FINDINGS:  Moderate degenerative cervical spondylosis with disc disease and  facet disease. No acute bony findings or destructive bony changes.  The skullbase C1 and C1-2 articulations are maintained. No acute  fracture.    The spinal canal is generous. No evidence of epidural hematoma or  mass. No obvious large cervical disc protrusions or significant  foraminal stenosis.    The lung apices are clear. Emphysematous changes are noted. No  obvious neck mass or adenopathy.   IMPRESSION:  Degenerative cervical spondylosis with disc disease and facet  disease but no acute cervical spine fracture orbone lesion.    No canal or foraminal encroachment is identified.      Electronically Signed    By: Kalman Jewels M.D.    On: 07/12/2013 15:15  Verified By: Marlane Hatcher, M.D.,   ASSESSMENT AND PLAN:  Assessment/Admission Diagnosis 54 year old admitted 07/09/13 with altered mental status/encephalopathy. Concerns of encephalitis vs meningitis. S/p LP today. Also found to have sepsis possibly from right knee infection +blood cultures with strep pneumonae 2/4 on [place]. S/p lavage by ortho of knee. Patient does not provide any history to me at this time. He just had PICC line placed.  Consulted for vent thrombosis, ongoing fevers, concern of infected thrombus, should triple lumen catheter left IJ be removed.  D/w Dr Lucky Cowboy. Thrombosis could be infected secondary to other infectious processes going on. His most recent blood cultures have been negative, but still had fever up to 101.6 in the last 24 hours. Central line has already been removed and PICC line was just placed. Agree that  central line needed to come out. Would have been preferable to have no central venous access for a day or two and use peripheral IV, then place PICC after- but hopefully there will be no issues.   Electronic Signatures: Su Grand (PA-C)  (Signed 14-Feb-15 14:10)  Authored: Chief Complaint and History, PAST MEDICAL/SURGICAL HISTORY, ALLERGIES, Family and Social History, Review of Systems, Physical Exam, LABS, RADIOLOGY, Assessment and Plan   Last Updated: 14-Feb-15 14:10 by Su Grand (PA-C)

## 2014-09-23 NOTE — Consult Note (Signed)
PATIENT NAME:  Raymond Gibson, Raymond Gibson MR#:  [id number] DATE OF BIRTH:  [birth year]  INFECTIOUS DISEASE CONSULT  DATE OF CONSULTATION:  07/06/2013  REFERRING PHYSICIAN:  Dr. Verdell Carmine.  CONSULTING PHYSICIAN:  Cheral Marker. Ola Spurr, MD  REASON FOR CONSULTATION: Pharyngitis and altered mental status.  HISTORY OF PRESENT ILLNESS: This is a 54 year old previously healthy gentleman who was brought in by EMS due to altered mental status and confusion. History is obtained from reviewing the chart. Per the patient's girlfriend, he was in his usual state of health when he developed some chills and upper respiratory symptoms 3 days ago. He continued to feel ill 2 days prior to admission and then developed some right knee pain. He had increasing pain and difficulty walking. He then developed some diarrhea and when he was last checked on this morning, he was altered and barely arousable. He was brought by ambulance to the Emergency Room.   In the Emergency Room there was a lot of agitation and the patient was given Ativan, but due to increase in agitation, he was intubated for airway protection. The patient was noted to have marked leukocytosis. Apparently he had also been complaining of neck pain and headache for a few days prior.   On arrival in the ICU, the patient was intubated so no further history could be obtained. He did have a marked effusion and pain in his right knee,  and even though he was intubated, he winced with pain with movement of the right knee. Arthrocentesis was done and 120 mL of purulent fluid was removed.   PAST MEDICAL AND SOCIAL HISTORY: Per admission note, he has no significant past medical history, social history. He smokes a pack a day. He uses occasional alcohol. No illicit drug use. He lives at home with his girlfriend. 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   FAMILY HISTORY: Mother and father are deceased, but it is unclear from what.   MEDICATIONS: Currently takes no medications  as an outpatient. In the Emergency Room he was given ceftriaxone, vancomycin, and acyclovir. HE APPARENTLY DEVELOPED A RASH TO CEFTRIAXONE.   REVIEW OF SYSTEMS: Unable to be obtained.   PHYSICAL EXAMINATION:  VITAL SIGNS: Temperature on arrival in the Emergency Room was 98.8. Pulse 66, blood pressure 154/92, respirations 33, sat 97% on ventilator.  GENERAL: A well-developed, well-nourished male lying in bed. He is intubated.  HEENT: Pupils are equal. Sclerae are anicteric. Oropharynx has ET tube in place.  NECK: Stiff and he resists flexion and grimaces when attempted to passively flex his neck. No anterior cervical, posterior cervical or supraclavicular lymphadenopathy.  HEART: Tachy but regular.  LUNGS: Clear with good air movement.  ABDOMEN: Soft, nontender, nondistended. No rigidity.  EXTREMITIES: No clubbing, cyanosis or edema. His right knee was markedly swollen and he winced and grimaced with passive range of motion. It was also warm and there was marked effusion.  SKIN: No evidence of purpura, petechiae, or other skin breakdown.  NEUROLOGIC: Intubated.   DIAGNOSTIC DATA: Labs are reviewed. White blood count was elevated at 23.4 with 90% neutrophils. Hemoglobin 15.0, platelets 144. Urine tox screen was positive for cannabis. Blood cultures x2 are pending. FLu swabs are negative. Urinalysis showed 2 white cells. Rapid HIV test was negative. Tylenol and salicylate levels were negative. LFTs were normal except slight elevation of AST at 42. CPK was elevated at 633. Creatinine was 1.16. Sugar was elevated at 180.   IMAGING: CT of his brain showed evidence of probable edema. There was  also questionable low density in the right parietooccipital region that appeared to represent an area of infarction.   Chest x-ray initially showed no acute pulmonary disease.   IMPRESSION: A 54 year old quite ill gentleman in the intensive care unit with likely meningitis as well as a septic knee. He is human  immunodeficiency virus negative. Cultures of the blood and knee are pending. He had a prodrome of possible respiratory illness and then fever and knee pain. Most likely this is Streptococcus pneumoniae or other gram-positive bacteremia causing seeding of his knee as well as of his central nervous system. Lumbar puncture could not be done due to the swelling in the brain.   He also been seen by neurology and he has been seen by orthopedics.   RECOMMENDATIONS:  1.  Continue vancomycin.  2.  SINCE HE DEVELOPED A RASH TO CEFTRIAXONE, I STARTED HIM ON MEROPENEM 2 G Q.8.  3.  Continue acyclovir but this is unlikely to be a viral encephalitis. I will wait to see what culture grows in his blood and knee cultures.  4.  Orthopedics is considering a washout which I would agree with.  5.  Continue critical care management per pulmonary critical care and hospitalist service.   Thank you for the consult. I would be glad to follow with you.   ____________________________ Cheral Marker. Ola Spurr, MD dpf:[id number] D: 07/06/2013 20:36:48 ET T: 07/06/2013 21:26:44 ET JOB#: [id number]  cc: Cheral Marker. Ola Spurr, MD, <Dictator> Ilee Randleman Ola Spurr MD ELECTRONICALLY SIGNED 07/10/2013 7:31

## 2014-09-23 NOTE — Consult Note (Signed)
Brief Consult Note: Diagnosis: presumed septic right knee.   Patient was seen by consultant.   Recommend to proceed with surgery or procedure.   Comments: will need washed out, optimally in OR arthroscopically, but with current critical status may be done at bedside with local anesthetic. Swelling down after aspiration, plan irrigation of knee tomorrow, pending medical status.  Electronic Signatures: Laurene Footman (MD)  (Signed 04-Feb-15 17:07)  Authored: Brief Consult Note   Last Updated: 04-Feb-15 17:07 by Laurene Footman (MD)

## 2014-09-23 NOTE — Op Note (Signed)
PATIENT NAME:  Raymond Gibson, MEHRER MR#:  [id number] DATE OF BIRTH:  02-22-1961  DATE OF PROCEDURE:  07/21/2013  PREOPERATIVE DIAGNOSIS: Septic right knee, recurrent.   POSTOPERATIVE DIAGNOSIS: Septic right knee, recurrent.   PROCEDURE: Right knee arthroscopy, synovectomy.   ANESTHESIA: General.   SURGEON: Laurene Footman, M.D.   DESCRIPTION OF PROCEDURE: The patient was brought to the operating room and after adequate anesthesia was obtained, the knee was prepped and draped in the usual sterile fashion with the arthroscopic leg holder applied, along with tourniquet. Tourniquet was not raised during the case. After patient identification and timeout procedures were completed and after having prepped and draped, the inferolateral portal was made and synovial fluid collected for analysis, cell count with differential and culture aerobic and anaerobic. The fluid was somewhat thin. There were some bits of tissue present as well. The arthroscope was introduced, and inspection revealed extensive synovitis and fibrous debris present within the joint consistent with recent infection. There was also extensive degenerative change present, particularly patellofemoral as well as the lateral compartment. After evaluating all compartments, a shaver was inserted, and extensive tricompartmental synovectomy was carried out, debriding synovium in the suprapatellar pouch and along the medial and lateral gutters as well as in front of the medial and lateral compartments and in the notch. There was also significant fibrous tissue within the joint that was interposed between the femoral and tibial condyles. After a thorough debridement was carried out, the knee was irrigated until clear. There did not appear to be additional loose fragments. After thorough irrigation, the instrumentation was withdrawn. The wound was left open to allow for drainage. Then, 30 mL of 0.25% Sensorcaine with epinephrine was infiltrated for  postoperative analgesia. Xeroform, 4 x 4's Webril and Ace wrap applied along with a Polar Care and knee immobilizer. The patient was then sent to the recovery room in stable condition.   ESTIMATED BLOOD LOSS: Minimal.   COMPLICATIONS: None.   SPECIMEN: Synovial fluid for culture and synovial cyst fluid analysis.   ____________________________ Laurene Footman, MD [id number] D: 07/21/2013 22:20:23 ET T: 07/22/2013 02:12:18 ET JOB#: [id number]  cc: Laurene Footman, MD, <Dictator> Laurene Footman MD ELECTRONICALLY SIGNED 07/22/2013 10:41

## 2014-09-23 NOTE — Discharge Summary (Signed)
PATIENT NAME:  Raymond Gibson, Raymond Gibson MR#:  [id number] DATE OF BIRTH:  Mar 18, 1961  DATE OF ADMISSION:  07/06/2013 DATE OF TRANSFER TO [facility]:  07/23/2013   DISCHARGE DIAGNOSES:   1.  Metabolic encephalopathy due to septic arthritis and meningitis; suspected bacterial meningitis, Streptococcus pneumoniae; sepsis with septic arthritis and meningitis.  2.  Malignant hypertension.  3.  Resolved acute respiratory failure.   CONSULTATIONS: ID consult with Dr. Ola Spurr, neurology consult with Dr. Jayme Cloud, pulmonary and critical care consult with Dr. Patricia Pesa, orthopedic consult with Dr. Rudene Christians.   PRESENT MEDICATIONS:  1.  Normal saline at 100 mL/hour. 2.  Tylenol 325 mg q.4 hours p.r.n. for fever. 3.  Keppra 750 mg IV q.12 hours. 4.  Meropenem 2 grams IV q.8 hours. 5.  Clonidine patch 0.3 mg transdermally weekly. 6.  Hydralazine 10 mg IV q.4-6 hours p.r.n. for systolic blood pressure more than 180/100. 7.  Lidoderm patch to the left neck area and right knee daily. 8.  Flexeril 10 mg p.o. q.8 hours for muscle relaxation. 9.  Diflucan 100 mg IV daily.  10.  Lovenox 40 mg p.o. daily.  11.  Protonix 40 mg p.o. daily.  12.  Motrin 400 mg every 8 hours pain and fever.  13.  Vancomycin 2 grams IV q.12 hours.   IMAGING AND LABORATORY DATA: The patient's white count on admission 23.4, hemoglobin 15, hematocrit 44.3, platelets 144. The patient's alcohol level was less than 3. Troponin less than 0.02 on admission. Normal salicylate levels. Urine was clear; the patient had WBC of only 2,  no bacteria. Urine toxicology was positive for only cannabinoids. Electrolytes on admission: Sodium 135, potassium 2.9, chloride 105, bicarbonate 19, BUN 16, creatinine 1.16, glucose 189. LFTs were within normal limits. Influenza titers were negative. Blood cultures on admission showed Streptococcus pneumoniae in both aerobic and anaerobic bottles; these are the blood cultures from February 4.   The patient's chest x-ray on  admission showed low volumes with pulmonary vascular congestion. Head CT on admission showed swelling of cerebral hemispheres. Ventricles are more prominent than sulci. Chest x-ray showed ET tube is in position and no acute abnormality, no pneumonia.   The patient's lactic acid was 2.2 on admission. Wound cultures from the right knee showed no growth. The patient's kidney function stayed stable. WBC increased to 25 on February 5. The patient's, CRP on February 5 was 311. HIV antibodies were negative.   Echocardiogram showed EF of 55% to 60% with normal LV function, and the patient has no evidence of vegetations per echo.   The patient's CT head was repeated on February 6, which showed brain has normal appearance with resolution of acute hydrocephalus and edema. Differential diagnosis was reversible changes of hypertension as well as meningeal encephalitis.   The patient's repeat blood cultures from February 7 were negative. C. difficile toxins were negative. Chest x-ray from February 8 showed no acute abnormality. White count persistently stayed about 20 until February 10. On February 11, it was 18.3.   The patient had a CT cervical spine without contrast showing degenerative disk disease, no evidence of fractures or bone lesions. The patient's CT was done without contrast.   Urine cultures were negative from February 12. Blood cultures repeatedly were negative on February 12.   The patient also had MRI of the neck with and without contrast, which showed marked motion abnormality, nearly nondiagnostic. MRO of the brain showed abnormal signal enhancement dependent within the ventricles and abnormal meningeal enhancement of  the frontal convexity concerning for meningitis, and debris in the ventricles.   The ultrasound of both arms showed right subclavian and axillary vein slow flow, thrombosis of the cephalic vein.   CSF cultures did not show any growth; that is from February 14. [place]  spotted fever serology is negative. EBV virus serology is negative. HSV 1 and 2 from CSF is negative. ( EBV virus CSF is negative. West Nile virus also is negative from CSF. The patient had an LP done on February 14. Cytomegalovirus is also negative by PCR. Blood cultures from February 17 also are negative. Urine cultures from February 18 from indwelling catheter were negative.   White count improved to 13.8 on February 18.   Wound cultures from the right knee from February 19 from synovial fluids: No growth in 36 hours.   The patient's white count today is 10.9, hemoglobin 9.9, hematocrit 29.9, platelets 630. Vancomycin level this morning 15. The patient's creatinine is 0.81.  VITAL SIGNS: This morning, temperature is 97.6, most recent temperature yesterday morning 100.9. Heart rate 115, blood pressure 110/68, sats 94% on room air.   HOSPITAL COURSE:  1.  Altered mental status. The patient is a 54 year old male with no past medical history, brought in because of altered mental status. The patient's wife was at work, tried to call him multiple times and he did not respond, brought in because of altered mental status. The patient was intubated and admitted to ICU for altered mental status and intubated prophylactically in the ER. The patient was found to have right knee effusion, and the patient initially was admitted for acute respiratory failure. He was on vent with sedation. The patient was successfully extubated on the 10th of February. The patient was seen by Dr. Mortimer Fries in critical care. 2.  Possible meningitis. The patient's CT head on admission showed some swelling, so he was on Solu-Cortef, and he also given her IV antibiotics for meningitis. The patient initially received Rocephin, but HE DEVELOPED A REACTION TO ROCEPHIN shortly after he got in ER, so he was started on vancomycin and also acyclovir. His white count was 23 on admission. The patient did not get an LP because of cerebral edema seen on  the CAT scan. The patient was given IV acyclovir and vancomycin and seen by Dr. Ola Spurr in consultation from [place]. Dr. Ola Spurr recommended IV, continuing IV vancomycin. The patient was thought to have meningitis as well as septic knee. The patient's blood cultures came back as Streptococcus pneumoniae with gram-positive bacteremia seeding his knee and also in central nervous system. The patient continued on vancomycin. THE PATIENT DEVELOPED RASH WITH CEFTRIAXONE, so he is on meropenem 2 grams IV q.8 since February 4 in addition to vancomycin, and he was continued on acyclovir but after HSV serology came back negative, his acyclovir was stopped. The patient was seen by Dr. Rudene Christians in consultation from orthopedics regarding his knee effusion and possible source of sepsis from right knee effusion. The patient had debridement at the bedside on February 5 in ICU, and he had another debridement of the knee on February 10. The patient had another knee lavage done yesterday again on February 19 with right knee arthroscopy and synovectomy. The patient had extensive synovitis and fibrinous debris within the joint with recent infection, and it was lavaged and cleaned. He had irrigation of the knee until it was clear. Knee cultures were sent from aerobic and anaerobic bacteria, but the problem is the patient has been spiking everyday  up to 102 on February 8 and 100.9 on February 20 as well. The  patient's white count was 23 but it is coming down, but he still had fever so Dr. Ola Spurr adjusted vancomycin and  meropenem doses. Blood cultures have been negative repeatedly. His C. difficile has been negative. The patient's chest x-ray also was negative. LP was done again on the 14th of February. So far, cultures are negative but the patient has been on vancomycin and meropenem. The patient does have neck stiffness with negative CT for abscess, and MRI of the neck also did not show any abscess or epidural abscess, so he is on  Flexeril for neck spasms. The patient's MRI of the neck is negative for any abscess. For recurrent fevers, he had blood cultures and urine cultures that were negative. Likely source is the knee, so he had another debridement and lavage done yesterday by Dr. Rudene Christians. The patient has been afebrile since the last 24 hours but still confused, and the patient is right now on vancomycin and meropenem. The patient will be transferred to [facility]. I spoke with Dr. Nolene Ebbs, who is the accepting physician, and the patient will be going there for further care.   For altered mental status, neurology was consulted. The patient was noted to have hearing loss since admission, thought to have (auditory nerve involvement  with meningitis. The patient needs audiology evaluation as an outpatient. He already received Solu-Cortef to decrease the brain swelling when he came. His serology for [place] Nile virus, [facility] spotted fever, CMV, HIV, HSV were all negative. At this time with altered mental status thought to be secondary to sequelae of meningitis, which is slowly resolving. Dr. Valora Corporal has seen the patient, and he suggested EEG on Monday but for now, he is on Keppra prophylactically for seizures. 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   The patient right now has right knee immobilizer to prevent flexion contractures of the right knee. He has very poor p.o. intake, and that is why he is on IV fluids, normal saline at 100 mL/hour. The patient also has malignant hypertension with elevated blood pressure up to 160/100. The patient right now is on hydralazine, clonidine patch, and p.r.n. beta blockers. He will be transferred to [facility] when the bed is available.   TIME SPENT ON DISCHARGE PREPARATION: More than 30 minutes. The patient's family was notified.   ____________________________ Epifanio Lesches, MD sk:jcm D: 07/23/2013 16:48:29 ET T: 07/23/2013 18:26:21  ET JOB#: [id number]  cc: Epifanio Lesches, MD, <Dictator> Epifanio Lesches MD ELECTRONICALLY SIGNED 07/28/2013 13:58

## 2014-09-23 NOTE — Op Note (Signed)
PATIENT NAME:  Raymond Gibson, Raymond Gibson MR#:  [id number] DATE OF BIRTH:  10/26/1960  DATE OF PROCEDURE:  07/07/2013  PREOPERATIVE DIAGNOSIS: Septic right knee.   POSTOPERATIVE DIAGNOSIS: Septic right knee.  PROCEDURE PERFORMED: Lavage, right knee.   ANESTHESIA:  Local, sedation while intubated.   DESCRIPTION OF PROCEDURE: This was done as a necessary procedure. The patient was unable to give consent himself.   INDICATIONS: The patient, the previous day, had been admitted with altered mental status, septicemia with positive blood cultures, possible meningitis and found to have a septic knee on aspiration by Dr. Ola Spurr. When I saw him, his swelling had gone down, but he would need further treatment and with his medical condition, he is not a good candidate for going to the operating room for arthroscopic debridement, so it was felt that a lavage at bedside was appropriate.   First, a timeout procedures carried out. The skin was prepped with alcohol superiorly, medially and laterally, and there is noted to be a large effusion present. A total of 30 mL of 1% Xylocaine was infiltrated in the area of the planned incisions. The knee was then prepped with DuraPrep and squared off in a sterile technique. A skin incision was made superomedially and superolaterally and 2 arthroscopic trocars were inserted with purulent material coming out of the knee. Suction was attached to the superior outflow cannula. The superomedial cannula was directed towards the inferomedial gutter and attached with a Christmas tree adapter to 6 liters of fluid, run sequentially saline and lactated Ringer's. This was used to irrigate out the knee. The fluid eventually became clear after a thorough irrigation of the knee and pressing around the knee to try to remove any debris. The cannulas were removed, and the wound was dressed with 4 x 4's and an Ace wrap. There were no complications. No specimen. EBL was minimal. The patient appeared to  tolerate the procedure well.   ____________________________ Laurene Footman, MD [id number] D: 07/07/2013 18:23:33 ET T: 07/07/2013 19:22:46 ET JOB#: [id number]  cc: Laurene Footman, MD, <Dictator> Laurene Footman MD ELECTRONICALLY SIGNED 07/08/2013 8:22

## 2014-09-23 NOTE — H&P (Signed)
PATIENT NAME:  Raymond Gibson, Raymond Gibson MR#:  [id number] DATE OF BIRTH:  November 19, 1960  DATE OF ADMISSION:  07/06/2013  PRIMARY CARE PHYSICIAN: Does not have one.   CHIEF COMPLAINT: Altered mental status and encephalopathy.   HISTORY OF PRESENT ILLNESS: This is a 54 year old male who was brought in to the hospital by EMS due to altered mental status and confusion. The patient himself currently is intubated and sedated as he could not maintain his airway, therefore most of the history obtained from the girlfriend at bedside. As per the girlfriend, the patient was in his usual state of health total this past Sunday when he developed some chills and upper respiratory symptoms. The patient did not have any sick contacts. He went to work fine on Monday, but still continued to have chills and developed some right knee pain. He was then sent home from work. During Monday, the patient has significant right knee pain and swelling and was not able to ambulate. The patient was having significant difficulty walking due to his right knee pain. The patient thought his knee was swollen.  He bought something over-the-counter as a diuretic to see if it would reduce his swelling. He started having frequency of urination. Also developed some diarrhea shortly thereafter. The patient's girlfriend then went to work as she works overnight. The next morning, she attempted to call him and check on him although he would not answer his phone. She called multiple times and eventually went to see him and he was very lethargic and arousable. She called 911 and he was brought here to the hospital. Upon EMS arrival, the patient was alert and arousable but not oriented and was not acting like himself. The patient came to the ER and was having some agitation and jerking type movements. The patient was given some Ativan, although continued to be significantly altered. The patient was then urgently intubated for airway protection. On routine blood work,  the patient was noted to have leukocytosis and suspected to have possible underlying meningitis/encephalitis. Hospitalist services were contacted for further treatment and evaluation.   REVIEW OF SYSTEMS:  CONSTITUTIONAL: No documented fever. No weight gain, no weight loss.  EYES: No blurred or double vision.  ENT: No tinnitus. No postnasal drip. No redness of the oropharynx.  RESPIRATORY: Positive cough. No wheeze, hemoptysis, no dyspnea.  CARDIOVASCULAR: No chest pain. No orthopnea, no palpitations, no syncope.  GASTROINTESTINAL: No nausea, no vomiting. Positive diarrhea. No melena or hematochezia.  GENITOURINARY: No dysuria, no hematuria.  ENDOCRINE: No polyuria or nocturia.  No heat or cold intolerance.  HEMATOLOGIC: No anemia, no bruising, no bleeding.  INTEGUMENTARY: No rashes. No lesions.  MUSCULOSKELETAL: No arthritis, no swelling, no gout.  NEUROLOGIC: No numbness or tingling. No ataxia. No seizure-type activity.  PSYCHIATRIC: No anxiety, no insomnia, no ADD.  PAST MEDICAL HISTORY:  Has no significant past medical history.   ALLERGIES: ROCEPHIN.   SOCIAL HISTORY: He does smoke about a pack per day, has been smoking for the past 20+ years. Occasional alcohol use. No illicit drug abuse. Lives at home with his girlfriend.   FAMILY HISTORY: Both mother and father are deceased. As per the girlfriend she does not know what the father died from.  The mother died from complications of a stroke and heart disease and a brain tumor.   MEDICATIONS:  He is currently no medications.   PHYSICAL EXAMINATION:  VITAL SIGNS:  Presently is as follows: Temperature 99.5. Pulse 104, respirations 22, blood pressure 159/106, sats 100%  oxygen on a ventilator.  GENERAL: He is a critically ill-appearing patient, intubated and sedated but in no apparent distress.  HEAD, EYES, EARS, NOSE, THROAT: He is atraumatic, normocephalic. His extraocular muscles are intact. His pupils to are equal and reactive to  light and accomodation. Sclerae anicteric. No conjunctival injection. No pharyngeal erythema.  NECK: Supple. There is no jugular venous distention.  No bruits, no lymphadenopathy, no thyromegaly.  HEART: Regular rate and rhythm. No murmurs. No rubs or clicks.  LUNGS: Clear to auscultation bilaterally. No rales or rhonchi. No wheezes.  ABDOMEN: Soft, flat, nontender, nondistended. Has good bowel sounds. No hepatosplenomegaly appreciated.  EXTREMITIES: No evidence of any cyanosis, clubbing, or peripheral edema. Has +2 pedal and radial pulses bilaterally.  NEUROLOGICALLY: The patient is alert, awake, and oriented x 0. The patient is sedated and intubated. No focal motor or sensory deficits appreciated bilaterally.  SKIN: Moist and warm with no rashes.  LYMPHATIC: There is no cervical or axillary lymphadenopathy.   LABORATORY DATA: Serum glucose 189, BUN 16, creatinine 1.16, sodium 135, potassium 2.9, chloride 105, bicarbonate 19. The patient's LFTs are within normal limits. CK 633. Troponin is less than 0.02. White cell count is 23.4, hemoglobin 15, hematocrit 44.3, platelet count 144, INR is 1. Rapid flu is negative. Urinalysis within normal limits. The patient's ABG showed a pH of 7.48, pCO2 25, pO2 59, sats 96%. This was on room air.   The patient did have a chest x-ray done which showed low volume with mild vascular congestion. The patient had a CT of the head done without contrast, which showed questioned swelling of cerebral hemispheres. Ventricles are more prominent and sulci, questioned low density in the right parieto-occipital region that could represent an area of infarction. This is not definite.   ASSESSMENT AND PLAN: This is a 54 year old male with no significant past medical history, who presents the hospital due to altered mental status and encephalopathy and suspected to have meningitis. The patient was quite agitated and confused and was intubated for airway protection.   PROBLEMS: 1.   Altered mental status/encephalopathy. This is likely metabolic encephalopathy from suspected meningitis. Although the patient is afebrile and hemodynamically stable, he does have a leukocytosis of 23,000. The patient will be empirically treated for meningitis bacterial and viral with ceftriaxone, vancomycin and acyclovir although THE PATIENT SEEMS TO HAVE HAD AN ALLERGIC REACTION TO CEFTRIAXONE shortly after he got it. I discussed the case with Dr. Ola Spurr from infectious disease, who will see the patient and place the patient on appropriate antibiotics and antivirals. The patient's CT head did show some cerebral edema and a possible stroke. Therefore, cannot do a lumbar puncture to get an accurate diagnosis. I will get a CT head with contrast as we cannot get an MRI at this time. We will get a neurology consultation.  The patient was seen by Dr. Irish Elders and continue recommendations as per them.   2.  Leukocytosis. This is likely secondary to the meningitis. I will follow white cell count with IV antibiotic therapy.  3.  Hypokalemia: I will go ahead and place the patient on some potassium supplements and follow his potassium in the morning.  4.  Acute respiratory failure. The patient was intubated due to airway protection due to altered mental status.  I will get a pulmonary consult for vent management. The patient is a FULL CODE.   Critical care time spent is 60 minutes    ____________________________ Belia Heman. Verdell Carmine, MD [id number] D: 07/06/2013 14:22:00  ET T: 07/06/2013 14:57:01 ET JOB#: [id number]  cc: Belia Heman. Verdell Carmine, MD, <Dictator> Henreitta Leber MD ELECTRONICALLY SIGNED 07/07/2013 11:53

## 2014-09-23 NOTE — Consult Note (Signed)
Brief Consult Note: Diagnosis: Meningitis, septic knee, Sepsis.   Patient was seen by consultant.   Consult note dictated.   Recommend further assessment or treatment.   Orders entered.   Discussed with Attending MD.   Comments: Cont vanco, meropenem and acyclovir for now Ortho to consider wash out once stable  Agree with neuro LP not safe. continue steroids as well Thank you for consult. Will follow with you.  Electronic Signatures: Angelena Form (MD)  (Signed 04-Feb-15 20:40)  Authored: Brief Consult Note   Last Updated: 04-Feb-15 20:40 by Angelena Form (MD)

## 2014-09-23 NOTE — Consult Note (Signed)
Referring Physician:  Henreitta Leber   Primary Care Physician:  Ashok Norris : [facility], [address], [place], [place], [place] [address], [place] [phone]  Reason for Consult: Admit Date: 06-Jul-2013  Chief Complaint: meningitis  Reason for Consult: meningitis   History of Present Illness: History of Present Illness:   54 year old man admitted with AMS and concern for meningitis vs encephalitis.  5 days ago the patient developed some chills and URI type symptoms.  During Monday he was sent home from work due to right knee pain and swelling.  He then purchased an "over the counter diuretic" to see if it would help the swelling dissipate.  Soon thereafter he began to have diarrhea.  Girlfriend then went to work on third shift.  The next AM she tried calling him and he didnt answer which is unusual for him.  She eventually went back to the house and he was confused and lethargic.  EMS was called and the patient was found to be confused.  In the ED he was seen to have some abnormal "jerking" type movements and was confsed.  His wbc count was elevated and there was concern for infection.  Patient seen to have meningismus with sensitivitiy moving the neck or bending it and concern was raised for meningitis vs encephalitis.  Symptoms moderate severity.  Constant.  No clear provokng factors.  Since admitted and started on antibiotics his cognition has improved.  Still a little somnolent but oriented x 3 today per family in the room.  Able to recognize and communicate normally with a number of visitors, even some visitors who he hadn't seen in awhile per his ex wife.   MEDICAL HISTORY:  known PMH or PSH. HISTORY: his girlfriend his mother died from complications of a stroke and heart disease and a brain tumor   SOCIAL HISTORY: about a pack per day x 20+ years. Occasional alcohol use. No illicit drug abuse. Lives at home with his girlfriend.   on any medications.   ROCEPHIN.         ROS:  General pain  meningismus neck pain   HEENT no complaints   Lungs no complaints   Cardiac no complaints   GI no complaints   GU no complaints   Musculoskeletal no complaints   Extremities no complaints   Skin no complaints   Endocrine no complaints   Psych no complaints   Past Medical/Surgical Hx:  Denies medical history:   [facility] Current Meds:  Dextrose 5%-NaCl 0.45% w/KCl 7mEq, 1000 ml at 75 ml/hr  Acetaminophen * tablet, ( Tylenol (325 mg) tablet)  650 mg Oral q4h PRN for pain or temp. greater than 100.4  - Indication: Pain/Fever  Ondansetron injection, ( Zofran injection )  4 mg, IV push, q4h PRN for Nausea/Vomiting  Indication: Nausea/ Vomiting  hydrALAZINE HCl injection, ( Apresoline injection )  10 mg, IV push, q4-6h PRN for SBP > 180 or DBP > 100  Indication: Hypertension/ CHF  levETIRAcetam injection,  ( Keppra injection )  750 mg in Dextrose 5% 100 ml, IV Piggyback, q12h, Infuse over 15 minute(s), Mixed in 100 ml given over 15 minutes.  Meropenem Injection, 2 gram in Sodium Chloride 0.9% 100 ml, IV Piggyback, q8h, Infuse over 3 hour(s)  Indication: Infection, <<<Extended infusion protocol>>>  Famotidine injection, ( Pepcid injection )  20 mg, IV Piggyback, q12h, Infuse over 30 minute(s)  Indication: Active Ulcer/Hypersecretory Conditions/Heartburn/Paclitaxel Reactions  Insulin SS -Novolog injection, Subcutaneous, FSBS  q6h  give no insulin if FSBS 0 - 140     2 unit(s) if FSBS 141 - 200     4 unit(s) if FSBS 201 - 250     6 unit(s) if FSBS 251 - 300     8 unit(s) if FSBS 301 - 350     10 unit(s) if FSBS 351 - 400  Call MD if Blood Glucose greater than 400, [Waste Code: Black]  Enoxaparin injection, ( Lovenox injection )  40 mg, Subcutaneous, q24h  Indication: Prophylaxis or treatment of thromboembolic disorders, Monitor Anticoags per hospital protocol  cloNIDine  0.$RemoveBefo'3mg'QlfCaqRaOLM$  patch,  ( Catapres TTS-3 patch )  1 patch Transdermal  weekly  -Indication:Hypertension/ Withdrawal Symptoms  Glycopyrrolate injection, 0.4 mg, IV push, q4h PRN for increased secreations  Indication: Decreased Secretions Pre-Surgery/Reversal Neuromuscular Bloc/GI Disorders, [Waste Code: SendToRx]  Orphenadrine SA tablet, 100 mg Oral bid  - Indication: Muscle Spasms/Supportive Therapy in Tetanus  Instructions:  DO NOT CRUSH  MorphINE  injection, 2 to 4 mg, IV push, q4h PRN for pain  Indication: Pain, [Med Admin Window: 30 mins before or after scheduled dose]  Lidocaine 5% patch, ( Lidoderm 5% patch )  Apply to to L neck area and  R knee daily  Instructions: APPLY daily at 9 am, REMOVE at 9 pm  Location of patch 1 -  Location of patch 2 -  Line Flush - Normal Saline, 5 ml, IV push, daily  Indication: Line patency, Flush each UNUSED port with 5 ml saline, 5 ml to each unused lumen daily  Line Flush - Normal Saline, 5 to 10 ml, IV push, ud PRN for line patency, Flush each lumen with 5 ml before and 10 ml after each med admin, TPN, blood draw or blood administration.  Line Flush Heparin 10 units/ml injection, 5 ml, IV push, ud PRN for line patency  Indication: Line Patency, Monitor Anticoags per hospital protocol, Flush lumen with 50 units after med admin, TPN, blood draws or blood administration.  Line Flush Heparin 10 units/ml injection, 5 ml, IV push, daily  Indication: Line Patency, Monitor Anticoags per hospital protocol, Flush each UNUSED port with 50 units Heparin every 24 hours., Flush each unused lumen with 50 units every 24 hours.  Allergies:  Rocephin: Blisters  Vital Signs: **Vital Signs.:   13-Feb-15 12:13  Vital Signs Type Routine  Temperature Temperature (F) 100.1  Celsius 37.8  Pulse Pulse 244  Systolic BP Systolic BP 975  Diastolic BP (mmHg) Diastolic BP (mmHg) 82  Mean BP 99  Pulse Ox % Pulse Ox % 93  Pulse Ox Activity Level  At rest  Oxygen Delivery 3L   EXAM: GENERAL: Sleepy, but overall pleasant and in NAD.   Normocephalic and atraumatic.  EYES: Funduscopic exam shows normal disc size, appearance and C/D ratio without clear evidence of papilledema.  CARDIOVASCULAR: S1 and S2 sounds are within normal limits, without murmurs, gallops, or rubs.  MUSCULOSKELETAL: Moderate to severe meningismus is noted on exam with passive head flexion. Bulk - Normal Tone - Normal Pronator Drift - Absent bilaterally. Ambulation - Gait and station are deferred due to falls risk.  R/L 5/5    Shoulder abduction (deltoid/supraspinatus, axillary/suprascapular n, C5) 5/5    Elbow flexion (biceps brachii, musculoskeletal n, C5-6) 5/5    Elbow extension (triceps, radial n, C7) 5/5    Finger adduction (interossei, ulnar n, T1)   5/5    Hip flexion (iliopsoas, L1/L2) 5/5    Knee flexion (hamstrings,  sciatic n, L5/S1) 5/5    Knee extension (quadriceps, femoral n, L3/4) 5/5    Ankle dorsiflexion (tibialis anterior, deep fibular n, L4/5) 5/5    Ankle plantarflexion (gastroc, tibial n, S1)  NEUROLOGICAL: MENTAL STATUS: Still rather somnolent but when aroused, he is oriented to person, place and time.  Recent and remote memory are intact.  Attention span and concentration are intact.  Naming, repetition, comprehension and expressive speech are within normal limits.  Patient's fund of knowledge is within normal limits for educational level.  CRANIAL NERVES: Normal    CN II (normal visual acuity and visual fields) Normal    CN III, IV, VI (extraocular muscles are intact) Normal    CN V (facial sensation is intact bilaterally) Normal    CN VII (facial strength is intact bilaterally) Normal    CN VIII (hearing is intact bilaterally) Normal    CN IX/X (palate elevates midline, normal phonation) Normal    CN XI (shoulder shrug strength is normal and symmetric) Normal    CN XII (tongue protrudes midline)   SENSATION: Intact to pain and temp bilaterally (spinothalamic tracts) Intact to position and vibration bilaterally  (dorsal columns)   REFLEXES: R/L 2+/2+    Biceps 2+/2+    Brachioradialis   2+/2+    Patellar 2+/2+    Achilles   COORDINATION/CEREBELLAR: Finger to nose testing is within normal limits..  Lab Results:  LabObservation:  06-Feb-15 13:50   OBSERVATION Reason for Test  13-Feb-15 10:44   OBSERVATION Reason for Test LIJ thrombosis, sepsis, ongoing fevers, r/o DVT  Hepatic:  04-Feb-15 10:25   Bilirubin, Total 0.8  Alkaline Phosphatase 52 (45-117 NOTE: New Reference Range 04/22/13)  SGPT (ALT) 22  SGOT (AST)  42  Total Protein, Serum  6.1  Albumin, Serum  2.5  TDMs:  06-Feb-15 09:22   Vancomycin, Trough LAB 15  Routine Micro:  04-Feb-15 10:25   Micro Text Report URINE CULTURE   COMMENT                   NO GROWTH IN 18-24 HOURS   ANTIBIOTIC                       Culture Comment NO GROWTH IN 18-24 HOURS  Result(s) reported on 07 Jul 2013 at 08:07AM.  Specimen Source IN AND OUT CATH    10:33   Micro Text Report BLOOD CULTURE   ORGANISM 1                STREPTOCOCCUS PNEUMONIAE   COMMENT                   IN AEROBIC AND ANAEROBIC BOTTLES   COMMENT                   -   GRAM STAIN                GRAM POSITIVE COCCI   ANTIBIOTIC                    ORG#1   CEFTRIAXONE                   S/0.064   ERYTHROMYCIN                  S/0.094   LEVOFLOXACIN                  S/0.50    OXACILLIN  R         VANCOMYCIN                    S/0.19    PENICILLIN MIC                S/0.125  Micro Text Report BLOOD CULTURE   ORGANISM 1                STREPTOCOCCUS PNEUMONIAE   COMMENT                   IN AEROBIC AND ANAEROBIC BOTTLES   COMMENT                   -   GRAM STAIN                GRAM POSITIVE COCCI   ANTIBIOTIC                    ORG#1   CEFTRIAXONE                   S/0.064   ERYTHROMYCIN                  S/0.125   LEVOFLOXACIN                  S/0.50    OXACILLIN                     R         VANCOMYCIN                    S/0.25    PENICILLIN MIC                 S/0.125  Micro Text Report INFLUENZA A+B ANTIGENS   COMMENT                   NEGATIVE FOR INFLUENZA A (ANTIGEN ABSENT)   COMMENT                   NEGATIVE FOR INFLUENZA B (ANTIGEN ABSENT)   ANTIBIOTIC                       Culture Comment IN AEROBIC AND ANAEROBIC BOTTLES  Culture Comment IN AEROBIC AND ANAEROBIC BOTTLES  Gram Stain 1 GRAM POSITIVE COCCI  Gram Stain 1 GRAM POSITIVE COCCI  Organism Name STREPTOCOCCUS PNEUMONIAE  Organism Name STREPTOCOCCUS PNEUMONIAE  Ceftriaxone Sensitivity S/0.064  Ceftriaxone Sensitivity S/0.064  Oxacillin Sensitivity R  Oxacillin Sensitivity R  Erythromycin Sensitivity S/0.094  Erythromycin Sensitivity S/0.125  Vancomycin Sensitivity S/0.19  Vancomycin Sensitivity S/0.25  Levofloxacin Sensitivity S/0.50  Levofloxacin Sensitivity S/0.50  Penicillin MIC S/0.125  Penicillin MIC S/0.125  Organism 1 STREPTOCOCCUS PNEUMONIAE  Organism 1 STREPTOCOCCUS PNEUMONIAE  Culture Comment . -  Culture Comment . -  Comment 1.. NEGATIVE FOR INFLUENZA A (ANTIGEN ABSENT) A negative result does not exclude influenza. Correlation with clinical impression is required.  Comment 2.. NEGATIVE FOR INFLUENZA B (ANTIGEN ABSENT)  Result(s) reported on 06 Jul 2013 at 11:35AM.    16:37   Micro Text Report WOUND AER/ANAEROBIC CULT   COMMENT                   NO GROWTH AEROBICALLY/ANAEROBICALLY IN 4 DAYS   GRAM STAIN  MANY WHITE BLOOD CELLS   GRAM STAIN                MANY RED BLOOD CELLS   GRAM STAIN                MODERATE GRAM POSITIVE COCCI IN CHAINS   GRAM STAIN                RARE YEAST RARE GRAM POSITIVE ROD   ANTIBIOTIC                       Culture Comment NO GROWTH AEROBICALLY/ANAEROBICALLY IN 4 DAYS  Specimen Source right knee  Gram Stain 1 MANY WHITE BLOOD CELLS  Gram Stain 2 MANY RED BLOOD CELLS  Gram Stain 3 MODERATE GRAM POSITIVE COCCI IN CHAINS  Gram Stain 4 RARE YEAST RARE GRAM POSITIVE ROD  Result(s) reported on 11 Jul 2013 at 10:17AM.  07-Feb-15 12:17   Micro Text Report BLOOD CULTURE   COMMENT                   NO GROWTH AEROBICALLY/ANAEROBICALLY IN 5 DAYS   ANTIBIOTIC                       Culture Comment NO GROWTH AEROBICALLY/ANAEROBICALLY IN 5 DAYS  Result(s) reported on 14 Jul 2013 at 12:00PM.  Specimen Source #1 right hand    12:50   Micro Text Report BLOOD CULTURE   COMMENT                   NO GROWTH AEROBICALLY/ANAEROBICALLY IN 5 DAYS   ANTIBIOTIC                       Culture Comment NO GROWTH AEROBICALLY/ANAEROBICALLY IN 5 DAYS  Result(s) reported on 14 Jul 2013 at 12:00PM.  Specimen Source line collection  08-Feb-15 12:32   Micro Text Report CLOSTRIDIUM DIFFICILE   C.DIFFICILE ANTIGEN       C.DIFFICILE GDH ANTIGEN : NEGATIVE   C.DIFFICILE TOXIN A/B     C.DIFFICILE TOXINS A AND B : NEGATIVE   INTERPRETATION            Negative for C. difficile.    ANTIBIOTIC                        12-Feb-15 15:35   Micro Text Report URINE CULTURE   COMMENT                   NO GROWTH IN 8-12 HOURS   ANTIBIOTIC                       Culture Comment NO GROWTH IN 8-12 HOURS  Result(s) reported on 15 Jul 2013 at 09:53AM.  Specimen Source INDWELLING CATHETER    15:45   Micro Text Report BLOOD CULTURE   COMMENT                   NO GROWTH IN 8-12 HOURS   ANTIBIOTIC                       Culture Comment NO GROWTH IN 8-12 HOURS  Result(s) reported on 15 Jul 2013 at 12:00AM.    15:51   Micro Text Report BLOOD CULTURE   COMMENT  NO GROWTH IN 8-12 HOURS   ANTIBIOTIC                       Culture Comment NO GROWTH IN 8-12 HOURS  Result(s) reported on 15 Jul 2013 at 12:00AM.  Lab:  04-Feb-15 10:45   pH (ABG)  7.48  PCO2  25  PO2  59  FiO2 21  Base Excess -3.0  HCO3  18.6  O2 Saturation 96.4  O2 Device ra  Specimen Site (ABG) RT RADIAL  Specimen Type (ABG) ARTERIAL  Patient Temp (ABG) 37.0 (Result(s) reported on 06 Jul 2013 at 10:50AM.)  Lactic Acid, Cardiopulmonary   3.8 (Result(s) reported on 06 Jul 2013 at 10:50AM.)    15:35   pH (ABG) 7.39  PCO2 35  PO2  401  FiO2 100  Base Excess  -3.1  HCO3  21.2  O2 Saturation  100.7  O2 Device 840  Specimen Site (ABG) RT RADIAL  Specimen Type (ABG) ARTERIAL  Patient Temp (ABG) 37.0  Mode ASSIST CONTROL  PEEP 5.0  Vt 500  Mechanical Rate 18  Lactic Acid, Cardiopulmonary  2.2 (Result(s) reported on 06 Jul 2013 at 03:51PM.)  06-Feb-15 10:40   pH (ABG) 7.39  PCO2 44  PO2  79  FiO2 40  Base Excess 1.2  HCO3  26.6  O2 Saturation 97.0  O2 Device 840  Specimen Site (ABG) RT RADIAL  Specimen Type (ABG) ARTERIAL  Mode ASSIST CONTROL  PEEP 5.0  Vt 500  Mechanical Rate 18 (Result(s) reported on 08 Jul 2013 at 10:48AM.)  Lactic Acid, Cardiopulmonary 0.90 (Result(s) reported on 08 Jul 2013 at 10:48AM.)  10-Feb-15 11:35   pH (ABG)  7.50  PCO2 41  PO2  64  FiO2 30  Base Excess  8.0  HCO3  32.0  O2 Saturation 95.2  O2 Device 840  Specimen Site (ABG) RT RADIAL  Specimen Type (ABG) ARTERIAL  Patient Temp (ABG) 37.0  Mode SPONTANEOUS  PSV 8  PEEP 5.0 (Result(s) reported on 12 Jul 2013 at 11:43AM.)  General Ref:  04-Feb-15 10:25   Acetaminophen, Serum < 2 (10-30 POTENTIALLY TOXIC:  > 200 mcg/mL  > 50 mcg/mL at 12 hr after  ingestion  > 300 mcg/mL at 4 hr after  ingestion)  Salicylates, Serum < 1.7 (0.0-2.8 Therapeutic 2.8-20.0 mg/dL Toxic >30.0 mg/dL)  05-Feb-15 15:52   C-Reactive Protein ========== TEST NAME ==========  ========= RESULTS =========  = REFERENCE RANGE =  C-REACTIVE PROTEIN,QUANT  C-Reactive Protein, Quant C-Reactive Protein, Quant       [H  311.5 mg/L           ]           0.0-4.9 Results confirmed on dilution.               [facility]            No: [id number]           [address] [place], [place] [address]           Lindon Romp, MD         [id number]   Result(s) reported on 08 Jul 2013 at 05:54AM.    23:40   HIV Antibodies ========== TEST  NAME ==========  ========= RESULTS =========  = REFERENCE RANGE =  HIV 1/2 AB W/CONF WB  Panel [id number] HIV 1/O/2 Abs-Index Value       [   <1.00                ]             <  1.00 Index Value: Specimen reactivity relative to the negative cutoff. HIV 1/O/2 Abs, Qual             [   Non Reactive         ]      Non Reactive               [facility]            No: [id number]           [address], [place], [place] [address]           Lindon Romp, MD [phone]   Result(s) reported on 08 Jul 2013 at [facility].  Cardiology:  04-Feb-15 11:44   Ventricular Rate 63  Atrial Rate 63  P-R Interval 144  QRS Duration 90  QT 418  QTc 427  P Axis 16  R Axis 46  T Axis 43  ECG interpretation Sinus rhythm with Premature supraventricular complexes Otherwise normal ECG When compared with ECG of 06-Jul-2013 11:41, Previous ECG has undetermined rhythm, needs review ----------unconfirmed---------- Confirmed by OVERREAD, NOT ([id number]), editor PEARSON, BARBARA ([id number]) on 07/07/2013 8:22:37 AM  06-Feb-15 13:50   Echo Doppler REASON FOR EXAM:     COMMENTS:     PROCEDURE: [facility] - ECHO DOPPLER COMPLETE(TRANSTHOR)  - Jul 08 2013  1:50PM   RESULT: Echocardiogram Report  Patient Name:   KENRY DAUBERT Date of Exam: 07/08/2013 Medical Rec #:  [id number]             Custom1: Date of Birth:  05/30/1961           Height:       74.0 in Patient Age:    57 years           Weight:       250.0 lb Patient Gender: M                  BSA:          2.39 m??  Indications: Endocarditis Sonographer:    Sherrie Sport RDCS Referring Phys: Abel Presto, J  Summary:  1. Left ventricular ejection fraction, by visual estimation, is 55 to  60%.  2. Normal global left ventricular systolic function.  3. Borderline concentric left ventricular hypertrophy.  4. Mildly increased leftventricular internal cavity size.  5. No evidence of cardiac mass or vegitation. No echocardiographic  evidence of infective  endocarditis. 2D AND M-MODE MEASUREMENTS (normal ranges within parentheses): Left Ventricle:          Normal IVSd (2D):    0.93 cm (0.7-1.1) LVPWd (2D):     1.19 cm (0.7-1.1) Aorta/LA:                  Normal LVIDd (2D):     5.40 cm (3.4-5.7) Aortic Root (2D): 3.60 cm (2.4-3.7) LVIDs (2D):     3.91 cm           Left Atrium (2D): 3.60 cm (1.9-4.0) LV FS (2D):     27.6 %   (>25%) LV EF (2D):     53.1 %   (>50%)                                   Right Ventricle:  RVd (2D):        9.77 cm LV DIASTOLIC FUNCTION: MV Peak E: 0.88 m/s E/e' Ratio: 7.70 MV Peak A: 0.54 m/s Decel Time: 215 msec E/A Ratio: 1.63 SPECTRAL DOPPLER ANALYSIS (where applicable): Mitral Valve: MV P1/2 Time: 62.35 msec MV Area, PHT: 3.53 cm?? Aortic Valve: AoV Max Vel: 1.38 m/s AoV Peak PG: 7.6 mmHg AoV Mean PG: LVOT Vmax: 0.86 m/s LVOT VTI:  LVOTDiameter: 2.20 cm AoV Area, Vmax: 2.37 cm?? AoV Area, VTI:  AoV Area, Vmn: Tricuspid Valve and PA/RV Systolic Pressure: TR Max Velocity: 2.20 m/s RA  Pressure: 10 mmHg RVSP/PASP: 29.3 mmHg Pulmonic Valve: PV Max Velocity: 1.10 m/s PV Max PG: 4.8 mmHg PV Mean PG:  PHYSICIAN INTERPRETATION: Left Ventricle: The left ventricular internal cavity size was mildly  increased. Borderline concentric left ventricular hypertrophy. Global LV  systolic function was normal. Left ventricular ejection fraction, by  visual estimation, is 55 to 60%. Spectral Doppler shows normal pattern of  LV diastolic filling. Right Ventricle: Normal right ventricular size, wall thickness, and  systolic function. Left Atrium: The left atrium is normal in size and structure. Right Atrium: The right atrium is normal in size and structure. Pericardium: There is no evidence of pericardial effusion. Mitral Valve: Structurally normal mitral valve, with normal leaflet  excursion; without any evidence of mitral stenosis or significant  regurgitation. No evidence of mitral valve  stenosis. Trace mitral valve  regurgitation is seen. No MV vegetation was visualized. Tricuspid Valve: The tricuspid valve is normal. Trivial tricuspid  regurgitation is visualized. The tricuspid regurgitant velocity is 2.20  m/s, and with an assumed right atrial pressure of 10 mmHg, the estimated  right ventricular systolic pressure is normal at 29.3 mmHg. No TV  vegetation was visualized. Aortic Valve: The aortic valve is trileaflet and structurally normal,  with normal leaflet excursion; without any evidence of aortic stenosis or  insufficiency. Trivial aortic valve regurgitation is seen. Pulmonic Valve: The pulmonic valve is not well seen. No indication of   pulmonic valve regurgitation. Aorta: The aortic root is normal in size and structure. Venous: The inferior vena cava was normal.  [id number] Kathlyn Sacramento MD Electronically signed by [id number] Kathlyn Sacramento MD Signature Date/Time: 07/11/2013/7:26:36 AM  *** Final ***  IMPRESSION: .    Verified By: Mertie Clause. ARIDA, M.D., MD  Routine Chem:  04-Feb-15 10:25   Glucose, Serum  189  BUN 16  Creatinine (comp) 1.16  Sodium, Serum  135  Potassium, Serum  2.9  Chloride, Serum 105  CO2, Serum  19  Calcium (Total), Serum  7.6  Anion Gap 11  Osmolality (calc) 276  eGFR (African American) >60  eGFR (Non-African American) >60 (eGFR values <59mL/min/1.73 m2 may be an indication of chronic kidney disease (CKD). Calculated eGFR is useful in patients with stable renal function. The eGFR calculation will not be reliable in acutely ill patients when serum creatinine is changing rapidly. It is not useful in  patients on dialysis. The eGFR calculation may not be applicable to patients at the low and high extremes of body sizes, pregnant women, and vegetarians.)  Ethanol, S. < 3  Ethanol % (comp) < 0.003 (Result(s) reported on 06 Jul 2013 at 11:25AM.)  Ammonia, Plasma 16 (Result(s) reported on 06 Jul 2013 at 11:12AM.)    10:33   Result  Comment AEROBIC AND ANAEROBIC - NOTIFIED OF CRITICAL VALUE  - TO Rod Can, CCU AT 2311 07/06/13  - BY [facility]K.  - READ-BACK PROCESS PERFORMED.  Result(s)  reported on 08 Jul 2013 at 08:47AM.  Result Comment AEROBIC AND ANAEROBIC - NOTIFIED OF CRITICAL VALUE  - TO Rod Can, CCU AT 2311 07/06/2013  - BY [facility]K.  - READ-BACK PROCESS PERFORMED.  Result(s) reported on 08 Jul 2013 at 08:46AM.    15:35   Result Comment - NOTIFIED OF CRITICAL VALUE  - HAND DELIVERED  - 07/06/13,1550,Dr. Mariea Clonts  Result(s) reported on 06 Jul 2013 at 03:51PM.  05-Feb-15 04:15   Result Comment DIFFERENTIAL - AUTO DIFFERENTIAL IS CONSISTENT WITH  - MANUAL...MTV  Result(s) reported on 07 Jul 2013 at 06:57AM.  Glucose, Serum  117  BUN 15  Creatinine (comp) 1.13  Sodium, Serum 138  Potassium, Serum 4.1  Chloride, Serum 107  CO2, Serum 26  Calcium (Total), Serum 8.8  Anion Gap  5  Osmolality (calc) 278  eGFR (African American) >60  eGFR (Non-African American) >60 (eGFR values <47mL/min/1.73 m2 may be an indication of chronic kidney disease (CKD). Calculated eGFR is useful in patients with stable renal function. The eGFR calculation will not be reliable in acutely ill patients when serum creatinine is changing rapidly. It is not useful in  patients on dialysis. The eGFR calculation may not be applicable to patients at the low and high extremes of body sizes, pregnant women, and vegetarians.)    15:52   Magnesium, Serum  2.6 (1.8-2.4 THERAPEUTIC RANGE: 4-7 mg/dL TOXIC: > 10 mg/dL  -----------------------)  06-Feb-15 03:55   Result Comment AUTOMATED DIFFERENTIAL - AUTOMATED DIFFERENTIAL IS CONSISTENT.  - TPL  Result(s) reported on 08 Jul 2013 at 04:15AM.  Glucose, Serum  141  BUN  20  Creatinine (comp) 1.15  Sodium, Serum 137  Potassium, Serum 4.1  Chloride, Serum 107  CO2, Serum 28  Calcium (Total), Serum 8.5  Anion Gap  2  Osmolality (calc) 279  eGFR (African American) >60  eGFR (Non-African  American) >60 (eGFR values <8mL/min/1.73 m2 may be an indication of chronic kidney disease (CKD). Calculated eGFR is useful in patients with stable renal function. The eGFR calculation will not be reliable in acutely ill patients when serum creatinine is changing rapidly. It is not useful in  patients on dialysis. The eGFR calculation may not be applicable to patients at the low and high extremes of body sizes, pregnant women, and vegetarians.)  Magnesium, Serum  2.5 (1.8-2.4 THERAPEUTIC RANGE: 4-7 mg/dL TOXIC: > 10 mg/dL  -----------------------)  Phosphorus, Serum  1.8 (Result(s) reported on 08 Jul 2013 at 05:10AM.)  Triglycerides, Serum  256 (Result(s) reported on 08 Jul 2013 at 05:10AM.)    09:22   Result Comment VANCOMYCIN TROUGH - This specimen was collected through an   - indwelling catheter or arterial line.  - A minimum of 40mls of blood was wasted prior    - to collecting the sample.  Interpret  - results with caution.  Result(s) reported on 08 Jul 2013 at 09:58AM.  07-Feb-15 03:48   Result Comment LABS - This specimen was collected through an   - indwelling catheter or arterial line.  - A minimum of 82mls of blood was wasted prior    - to collecting the sample.  Interpret  - results with caution.  Result(s) reported on 09 Jul 2013 at 04:23AM.  Result Comment DIFFERENTIAL - SMEAR SCANNED  Result(s) reported on 09 Jul 2013 at 06:52AM.  Glucose, Serum  147  BUN  20  Creatinine (comp) 1.00  Sodium, Serum 141  Potassium, Serum 3.9  Chloride, Serum  108  CO2,  Serum 32  Calcium (Total), Serum 8.6  Anion Gap  1  Osmolality (calc) 287  eGFR (African American) >60  eGFR (Non-African American) >60 (eGFR values <36mL/min/1.73 m2 may be an indication of chronic kidney disease (CKD). Calculated eGFR is useful in patients with stable renal function. The eGFR calculation will not be reliable in acutely ill patients when serum creatinine is changing rapidly. It is not useful in   patients on dialysis. The eGFR calculation may not be applicable to patients at the low and high extremes of body sizes, pregnant women, and vegetarians.)  Magnesium, Serum 2.3 (1.8-2.4 THERAPEUTIC RANGE: 4-7 mg/dL TOXIC: > 10 mg/dL  -----------------------)  Phosphorus, Serum  1.8 (Result(s) reported on 09 Jul 2013 at 04:24AM.)    11:30   Phosphorus, Serum 2.6 (Result(s) reported on 09 Jul 2013 at 11:49AM.)  08-Feb-15 04:01   Result Comment LABS - This specimen was collected through an   - indwelling catheter or arterial line.  - A minimum of 75mls of blood was wasted prior    - to collecting the sample.  Interpret  - results with caution.  Result(s) reported on 10 Jul 2013 at 04:25AM.  Glucose, Serum  126  BUN  20  Creatinine (comp) 1.05  Sodium, Serum 144  Potassium, Serum 3.7  Chloride, Serum  108  CO2, Serum  34  Calcium (Total), Serum 8.5  Anion Gap  2  Osmolality (calc) 291  eGFR (African American) >60  eGFR (Non-African American) >60 (eGFR values <42mL/min/1.73 m2 may be an indication of chronic kidney disease (CKD). Calculated eGFR is useful in patients with stable renal function. The eGFR calculation will not be reliable in acutely ill patients when serum creatinine is changing rapidly. It is not useful in  patients on dialysis. The eGFR calculation may not be applicable to patients at the low and high extremes of body sizes, pregnant women, and vegetarians.)  Magnesium, Serum 2.2 (1.8-2.4 THERAPEUTIC RANGE: 4-7 mg/dL TOXIC: > 10 mg/dL  -----------------------)  Phosphorus, Serum 2.5 (Result(s) reported on 10 Jul 2013 at 04:27AM.)  09-Feb-15 04:24   Result Comment LABS - This specimen was collected through an   - indwelling catheter or arterial line.  - A minimum of 11mls of blood was wasted prior    - to collecting the sample.  Interpret  - results with caution.  Result(s) reported on 11 Jul 2013 at 04:41AM.  Glucose, Serum  165  BUN  26  Creatinine (comp)  0.89  Sodium, Serum 144  Potassium, Serum 3.9  Chloride, Serum  108  CO2, Serum  35  Calcium (Total), Serum 8.6  Anion Gap  1  Osmolality (calc) 295  eGFR (African American) >60  eGFR (Non-African American) >60 (eGFR values <78mL/min/1.73 m2 may be an indication of chronic kidney disease (CKD). Calculated eGFR is useful in patients with stable renal function. The eGFR calculation will not be reliable in acutely ill patients when serum creatinine is changing rapidly. It is not useful in  patients on dialysis. The eGFR calculation may not be applicable to patients at the low and high extremes of body sizes, pregnant women, and vegetarians.)  Magnesium, Serum  2.5 (1.8-2.4 THERAPEUTIC RANGE: 4-7 mg/dL TOXIC: > 10 mg/dL  -----------------------)  Phosphorus, Serum 3.0 (Result(s) reported on 11 Jul 2013 at 04:43AM.)  10-Feb-15 04:41   Glucose, Serum  162  BUN  29  Creatinine (comp) 0.95  Sodium, Serum  146  Potassium, Serum 4.1  Chloride, Serum  111  CO2,  Serum  35  Calcium (Total), Serum 9.1  Anion Gap  0  Osmolality (calc) 300  eGFR (African American) >60  eGFR (Non-African American) >60 (eGFR values <78mL/min/1.73 m2 may be an indication of chronic kidney disease (CKD). Calculated eGFR is useful in patients with stable renal function. The eGFR calculation will not be reliable in acutely ill patients when serum creatinine is changing rapidly. It is not useful in  patients on dialysis. The eGFR calculation may not be applicable to patients at the low and high extremes of body sizes, pregnant women, and vegetarians.)  Magnesium, Serum 2.3 (1.8-2.4 THERAPEUTIC RANGE: 4-7 mg/dL TOXIC: > 10 mg/dL  -----------------------)  Phosphorus, Serum 2.5 (Result(s) reported on 12 Jul 2013 at 05:05AM.)  11-Feb-15 04:51   Result Comment labs - This specimen was collected through an   - indwelling catheter or arterial line.  - A minimum of 65mls of blood was wasted prior    - to  collecting the sample.  Interpret  - results with caution. MONOCYTES - CONSISTENT WITH PREVIOUS RESULTS.  Result(s) reported on 13 Jul 2013 at 05:31AM.  Glucose, Serum  116  BUN  25  Creatinine (comp) 0.89  Sodium, Serum 145  Potassium, Serum 3.9  Chloride, Serum  108  CO2, Serum 31  Calcium (Total), Serum 9.3  Anion Gap  6  Osmolality (calc) 294  eGFR (African American) >60  eGFR (Non-African American) >60 (eGFR values <10mL/min/1.73 m2 may be an indication of chronic kidney disease (CKD). Calculated eGFR is useful in patients with stable renal function. The eGFR calculation will not be reliable in acutely ill patients when serum creatinine is changing rapidly. It is not useful in  patients on dialysis. The eGFR calculation may not be applicable to patients at the low and high extremes of body sizes, pregnant women, and vegetarians.)  12-Feb-15 05:17   Result Comment LABS - This specimen was collected through an   - indwelling catheter or arterial line.  - A minimum of 90mls of blood was wasted prior    - to collecting the sample.  Interpret  - results with caution. MONOCYTES - CONSISTENT WITH PREVIOUS RESULTS.  Result(s) reported on 14 Jul 2013 at 06:10AM.  Urine Drugs:  [id number] 32:67   Tricyclic Antidepressant, Ur Qual (comp) NEGATIVE (Result(s) reported on 06 Jul 2013 at 11:54AM.)  Amphetamines, Urine Qual. NEGATIVE  MDMA, Urine Qual. NEGATIVE  Cocaine Metabolite, Urine Qual. NEGATIVE  Opiate, Urine qual NEGATIVE  Phencyclidine, Urine Qual. NEGATIVE  Cannabinoid, Urine Qual. POSITIVE  Barbiturates, Urine Qual. NEGATIVE  Benzodiazepine, Urine Qual. NEGATIVE (----------------- The URINE DRUG SCREEN provides only a preliminary, unconfirmed analytical test result and should not be used for non-medical  purposes.  Clinical consideration and professional judgment should be  applied to any positive drug screen result due to possible interfering substances.  A more specific  alternate chemical method must be used in order to obtain a confirmed analytical result.  Gas chromatography/mass spectrometry (GC/MS) is the preferred confirmatory method.)  Methadone, Urine Qual. NEGATIVE  Cardiac:  04-Feb-15 10:25   CK, Total  633 (39-308 NOTE: NEW REFERENCE RANGE  07/04/2013)  CPK-MB, Serum  6.3 (Result(s) reported on 06 Jul 2013 at 11:25AM.)  Troponin I < 0.02 (0.00-0.05 0.05 ng/mL or less: NEGATIVE  Repeat testing in 3-6 hrs  if clinically indicated. >0.05 ng/mL: POTENTIAL  MYOCARDIAL INJURY. Repeat  testing in 3-6 hrs if  clinically indicated. NOTE: An increase or decrease  of 30% or more on serial  testing suggests a  clinically important change)  Routine UA:  04-Feb-15 10:25   Color (UA) Amber  Clarity (UA) Clear  Glucose (UA) 100 mg/dL  Bilirubin (UA) Negative  Ketones (UA) 2+  Specific Gravity (UA) 1.015  Blood (UA) 3+  pH (UA) 6.0  Protein (UA) 75 mg/dL  Nitrite (UA) Negative  Leukocyte Esterase (UA) Negative (Result(s) reported on 06 Jul 2013 at 11:12AM.)  RBC (UA) 58 /HPF  WBC (UA) 2 /HPF  Bacteria (UA) NONE SEEN  Epithelial Cells (UA) <1 /HPF  Mucous (UA) PRESENT (Result(s) reported on 06 Jul 2013 at 11:12AM.)  12-Feb-15 15:35   Color (UA) Amber  Clarity (UA) Clear  Glucose (UA) Negative  Bilirubin (UA) Negative  Ketones (UA) Negative  Specific Gravity (UA) 1.015  Blood (UA) 2+  pH (UA) 7.0  Protein (UA) 30 mg/dL  Nitrite (UA) Negative  Leukocyte Esterase (UA) Trace (Result(s) reported on 14 Jul 2013 at 04:11PM.)  RBC (UA) 37 /HPF  WBC (UA) 26 /HPF  Bacteria (UA) TRACE  Epithelial Cells (UA) NONE SEEN  Mucous (UA) PRESENT (Result(s) reported on 14 Jul 2013 at 04:11PM.)  Routine Sero:  04-Feb-15 10:25   Rapid HIV 1/2 Ab Test with Confirmation ([facility]) NEG - HIV 1/2 AB This is a screening test for the presence of HIV-1/2 antibodies. False-negative and false-positive results can occur. All preliminary positive samples are sent  for confirmatory testing. Clinical correlation is necessary to assess whether repeat testing may be needed for negative results.  Routine Coag:  04-Feb-15 10:25   Activated PTT (APTT) 29.8 (A HCT value >55% may artifactually increase the APTT. In one study, the increase was an average of 19%. Reference: "Effect on Routine and Special Coagulation Testing Values of Citrate Anticoagulant Adjustment in Patients with High HCT Values." American Journal of Clinical Pathology 2006;126:400-405.)  Prothrombin 13.5  INR 1.0 (INR reference interval applies to patients on anticoagulant therapy. A single INR therapeutic range for coumarins is not optimal for all indications; however, the suggested range for most indications is 2.0 - 3.0. Exceptions to the INR Reference Range may include: Prosthetic heart valves, acute myocardial infarction, prevention of myocardial infarction, and combinations of aspirin and anticoagulant. The need for a higher or lower target INR must be assessed individually. Reference: The Pharmacology and Management of the Vitamin K  antagonists: the seventh ACCP Conference on Antithrombotic and Thrombolytic Therapy. MAUQJ.3354 Sept:126 (3suppl): [id number]. A HCT value >55% may artifactually increase the PT.  In one study,  the increase was an average of 25%. Reference:  "Effect on Routine and Special Coagulation Testing Values of Citrate Anticoagulant Adjustment in Patients with High HCT Values." American Journal of Clinical Pathology 2006;126:400-405.)  Routine Hem:  04-Feb-15 10:25   WBC (CBC)  23.4  RBC (CBC) 4.65  Hemoglobin (CBC) 15.0  Hematocrit (CBC) 44.3  Platelet Count (CBC)  144  MCV 95  MCH 32.2  MCHC 33.8  RDW 13.6  Neutrophil % 90.5  Lymphocyte % 2.1  Monocyte % 6.5  Eosinophil % 0.7  Basophil % 0.2  Neutrophil #  21.2  Lymphocyte #  0.5  Monocyte #  1.5  Eosinophil # 0.2  Basophil # 0.0 (Result(s) reported on 06 Jul 2013 at 10:47AM.)  05-Feb-15  04:15   WBC (CBC)  25.0  RBC (CBC)  4.23  Hemoglobin (CBC) 13.7  Hematocrit (CBC) 40.5  Platelet Count (CBC)  125  MCV 96  MCH 32.3  MCHC 33.8  RDW 13.8  Neutrophil % 86.9  Lymphocyte %  3.0  Monocyte % 9.8  Eosinophil % 0.2  Basophil % 0.1  Neutrophil #  21.7  Lymphocyte #  0.8  Monocyte #  2.5  Eosinophil # 0.1  Basophil # 0.0  06-Feb-15 03:55   WBC (CBC)  24.9  RBC (CBC)  3.98  Hemoglobin (CBC) 13.0  Hematocrit (CBC)  38.1  Platelet Count (CBC)  122  MCV 96  MCH 32.6  MCHC 34.0  RDW 13.9  Neutrophil % 84.4  Lymphocyte % 4.4  Monocyte % 11.1  Eosinophil % 0.0  Basophil % 0.1  Neutrophil #  21.0  Lymphocyte # 1.1  Monocyte #  2.8  Eosinophil # 0.0  Basophil # 0.0  07-Feb-15 03:48   WBC (CBC)  18.4  RBC (CBC)  3.94  Hemoglobin (CBC)  12.8  Hematocrit (CBC)  37.4  Platelet Count (CBC) 161  MCV 95  MCH 32.6  MCHC 34.3  RDW 14.0  Neutrophil % 78.5  Lymphocyte % 7.7  Monocyte % 13.4  Eosinophil % 0.1  Basophil % 0.3  Neutrophil #  14.5  Lymphocyte # 1.4  Monocyte #  2.5  Eosinophil # 0.0  Basophil # 0.1  08-Feb-15 04:01   WBC (CBC)  19.9  RBC (CBC)  4.19  Hemoglobin (CBC) 13.2  Hematocrit (CBC) 40.0  Platelet Count (CBC) 233 (Result(s) reported on 10 Jul 2013 at 04:43AM.)  MCV 96  MCH 31.5  MCHC 33.0  RDW 14.0  Bands 2  Segmented Neutrophils 68  Lymphocytes 10  Variant Lymphocytes 7  Monocytes 13  Diff Comment 1 TOXIC GRANULATION  Diff Comment 2 PLTS VARIED IN SIZE  Diff Comment 3 RBCs APPEAR NORMAL  Result(s) reported on 10 Jul 2013 at 04:43AM.  09-Feb-15 04:24   WBC (CBC)  21.0  RBC (CBC)  4.19  Hemoglobin (CBC) 13.2  Hematocrit (CBC) 40.5  Platelet Count (CBC) 296 (Result(s) reported on 11 Jul 2013 at 05:54AM.)  MCV 97  MCH 31.5  MCHC 32.5  RDW 14.2  Bands 1  Segmented Neutrophils 74  Lymphocytes 12  Variant Lymphocytes 4  Monocytes 5  Metamyelocyte 2  Myelocyte 2  NRBC 1  Diff Comment 1 PLTS VARIED IN SIZE  Diff Comment  2 RBCs APPEAR NORMAL  Diff Comment 3 TOXIC GRANULATION  Result(s) reported on 11 Jul 2013 at 05:54AM.  11-Feb-15 04:51   WBC (CBC)  18.3  RBC (CBC)  4.06  Hemoglobin (CBC) 13.3  Hematocrit (CBC)  39.2  Platelet Count (CBC) 415  MCV 97  MCH 32.7  MCHC 33.9  RDW 14.4  Neutrophil % 73.3  Lymphocyte % 12.5  Monocyte % 13.7  Eosinophil % 0.1  Basophil % 0.4  Neutrophil #  13.4  Lymphocyte # 2.3  Monocyte #  2.5  Eosinophil # 0.0  Basophil # 0.1  12-Feb-15 05:17   WBC (CBC)  18.0  RBC (CBC)  4.05  Hemoglobin (CBC) 13.2  Hematocrit (CBC)  39.2  Platelet Count (CBC) 420  MCV 97  MCH 32.6  MCHC 33.8  RDW 14.2  Neutrophil % 72.4  Lymphocyte % 11.1  Monocyte % 16.1  Eosinophil % 0.2  Basophil % 0.2  Neutrophil #  13.0  Lymphocyte # 2.0  Monocyte #  2.9  Eosinophil # 0.0  Basophil # 0.0   Radiology Results: CT:    04-Feb-15 11:32, CT Head Without Contrast  CT Head Without Contrast   REASON FOR EXAM:    ams  COMMENTS:  PROCEDURE: CT  - CT HEAD WITHOUT CONTRAST  - Jul 06 2013 11:32AM     CLINICAL DATA:  Altered mental status    EXAM:  CT HEAD WITHOUT CONTRAST    TECHNIQUE:  Contiguous axial images were obtained from the base of the skull  through the vertex without intravenous contrast.    COMPARISON:  None.  FINDINGS:  The brainstem and cerebellum appear normal. One could question mild  swelling of the cerebral hemispheres. The ventricles appear slightly  out of proportion to the sulci. I question low density in the right  parieto-occipital region. This is not definite. No side of  intracranial hemorrhage. No skull fracture. No fluid in the sinuses,  middle ears or mastoids.     IMPRESSION:  Question swelling of the cerebral hemispheres. Ventricles are more  prominent than the sulci.    Question low density in the right parieto-occipital region that  could represent an area of infarction. This is not definite.  Electronically Signed    By: Nelson Chimes M.D.    On: 07/06/2013 11:36         Verified By: Jules Schick, M.D.,    04-Feb-15 15:14, CT Head With Contrast  CT Head With Contrast   REASON FOR EXAM:    Meningitis. Cerebral edema/?? CVA.  Cannot do MRI as   pt. is Intubated.  COMMENTS:       PROCEDURE: CT  - CT HEAD WITH CONTRAST  - Jul 06 2013  3:14PM     CLINICAL DATA:  Meningitis.  Stroke.  Cerebral edema.    EXAM:  CT HEAD WITH CONTRAST    TECHNIQUE:  Contiguous axial images were obtained from the base of the skull  through the vertex with intravenous contrast.  CONTRAST:  75 cc [id number]    COMPARISON:  Earlier same day    FINDINGS:  Again the brain in general appears swollen with absence of visible  sulci. Ventricles are slightly prominent. There is continued  question of a small area of low density in the right  parieto-occipital region that could represent a small focal  infarction. No shift. No focal enhancing lesion.     IMPRESSION:  No focal enhancing lesion. Likely generalized brain swelling. Is  some prominence of the ventricular system could represent early  communicating hydrocephalus.  Continued question of a small area of low density in the right  parietal occipital brain that could represent an area of focal  infarction. This is not definite.      Electronically Signed    By: Nelson Chimes M.D.    On: 07/06/2013 15:16         Verified By: Jules Schick, M.D.,    06-Feb-15 15:12, CT Head Without Contrast  CT Head Without Contrast   REASON FOR EXAM:    AMS, edema  COMMENTS:       PROCEDURE: CT  - CT HEAD WITHOUT CONTRAST  - Jul 08 2013  3:12PM     CLINICAL DATA:  Altered mental status.    EXAM:  CT HEAD WITHOUT CONTRAST    TECHNIQUE:  Contiguous axial images were obtained from the base of the skull  through the vertex without intravenous contrast.    COMPARISON:  07/06/2013  FINDINGS:  Ventricle size has returned to normal. Cerebral edema also has  resolved. The sulci  now appear normal.    Negative for hemorrhage, mass, or infarct. The questionable area of  hypodensity in  the right occipital parietal lobe is no longer  present. This may have been artifactual however consideration could  be given to reversible process such as hypertensive encephalopathy.  Cerebritis/meningitis also a consideration.    No acute bony changes.  Mild mucosal edema in the paranasal sinuses.     IMPRESSION:  The brain now has a normal appearance with resolution of acute  hydrocephalus and possible cerebral edema. Differential diagnosis  includes reversible changes of hypertensive encephalopathy as well  as meningeal encephalitis. Correlate with LP and CSF analysis.      Electronically Signed    By: Franchot Gallo M.D.    On: 07/08/2013 15:28         Verified By: Truett Perna, M.D.,   Impression/Recommendations: Recommendations:   54 year old man admitted with AMS and concern for meningitis vs encephalitis.  admitted with elevated WBC, meningismus, AMS and low grade fevers.  Agree with concern for encphealitis.  Especially in light of the HCT initially showing some mild edema that has now resolved with antibiotics.  I have not seen patient before today but his cognition per all accounts seems much improved compared with admission.  Agree with continuing antibiotics.  I do not see that a spinal tap was performed.  Would consider spinal tap to check for symptoms of viral vs bacterial meningitis which of course would affect whther antibiotics or antiviral agents are used.  No focal deficits on exam.   have reviewed the results of the most recent imaging studies, tests and labs as outlined above and answered all related questions.  have personally viewed the patient's HCTs and there is serial improvement and reduction in the degree of edema seen. and coordinated plan of care with hospitalist.   Electronic Signatures: Anabel Bene (MD)  (Signed 14-Feb-15  03:15)  Authored: REFERRING PHYSICIAN, Primary Care Physician, Consult, History of Present Illness, Review of Systems, PAST MEDICAL/SURGICAL HISTORY, HOME MEDICATIONS, Current Medications, ALLERGIES, NURSING VITAL SIGNS, Physical Exam-, LAB RESULTS, RADIOLOGY RESULTS, Recommendations   Last Updated: 14-Feb-15 03:15 by Anabel Bene (MD)

## 2014-09-23 NOTE — Consult Note (Signed)
PATIENT NAME:  Raymond Gibson, Raymond Gibson MR#:  [id number] DATE OF BIRTH:  December 09, 1960  DATE OF CONSULTATION:  07/06/2013  CONSULTING PHYSICIAN:  Leotis Pain, MD  HISTORY OF PRESENT ILLNESS: Information obtained from the patient's girlfriend who is at bedside. This is a 54 year old male with no past medical history, not on any medications, just history of social EtOH use and 1-pack-a-day smoking history who presents with confusion, disorientation.   The patient's significant other states that last week the patient had episodes of diarrhea which had improved, and for the past 2 days he has been having fever and chills. Last spoken to the patient 8 p.m. last night. 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 The patient was last seen to be normal at 8 p.m. When she attempted to call multiple times this morning, the patient did not appear to be responsive. When she came home, the patient was not responsive, did not respond to sternal rub. EMS was called upon.   Upon arrival the patient appeared to be febrile, not following commands, and needed to be intubated for airway protection. He was started on broad-spectrum antibiotics for suspicion of meningitis. Status post CAT scan of the brain that I evaluated, that appears to have diffuse swelling.   Of note, the patient has also been complaining of a week's history of right knee pain which appears to be swollen.   The patient's lab work includes a drug screen positive for cannabinoid use. Troponins are negative.   Hematology: White blood cell count is elevated 23.4, hemoglobin 15, hematocrit 44.3. Neutrophilic predominance.   CAT scan: Diffuse cerebral edema.   REVIEW OF SYSTEMS: Unable to obtain.   SOCIAL HISTORY: The patient works with young teens in a school setting. A pack-per-day smoker. Unknown the duration which is over 5 years. Social EtOH use.   PHYSICAL EXAMINATION:  GENERAL: Intubated. Does not follow commands.  NEUROLOGIC:  Pupils 2 mm, not reactive. Corneals are present. The patient is currently on Versed drip. Does not withdraw from painful stimuli. Reflexes are 1+ symmetrical.  Sensation: Unable to assess. Coordination: Unable to assess. Gait: Unable to assess.   IMPRESSION: A 54 year old male with a with a history of social EtOH use and 1-pack-per-day smoker who comes in with altered mental status. The patient's significant other states that he has had headaches and neck pain for a few days prior to admission along with fever and chills, swollen right knee. Suspicion for bacterial meningitis.   PLAN: I agree with tapping the knee to look for possible source. Would hold off performing LP at this point, because on the CAT scan, there is diffuse cerebral edema and the possibility of herniation. Broad-spectrum antibiotics that he is on right now as well as acyclovir, continue those. Please add on antiepileptic. Put him on Keppra 750 mg twice a day because of his diffuse cerebral edema for possibility of nonconvulsive seizures.   The patient would also receive CAT scan with contrast to see if there is any underlying mass because we cannot get MRI due to intubation.   ____________________________ Leotis Pain, MD [id number] D: 07/06/2013 14:31:09 ET T: 07/06/2013 15:06:35 ET JOB#: [id number]  cc: Leotis Pain, MD, <Dictator> Leotis Pain MD ELECTRONICALLY SIGNED 07/25/2013 13:15

## 2015-03-08 ENCOUNTER — Ambulatory Visit: Payer: Self-pay | Admitting: Family Medicine

## 2015-03-23 ENCOUNTER — Ambulatory Visit (INDEPENDENT_AMBULATORY_CARE_PROVIDER_SITE_OTHER): Payer: Medicaid Other | Admitting: Family Medicine

## 2015-03-23 ENCOUNTER — Encounter: Payer: Self-pay | Admitting: Family Medicine

## 2015-03-23 VITALS — BP 132/71 | HR 85 | Temp 98.5°F | Resp 19 | Ht 76.0 in | Wt 238.5 lb

## 2015-03-23 DIAGNOSIS — H9193 Unspecified hearing loss, bilateral: Secondary | ICD-10-CM

## 2015-03-23 NOTE — Progress Notes (Signed)
Name: Raymond Gibson   MRN: [id number]    DOB: 05/18/1961   Date:03/23/2015       Progress Note  Subjective  Chief Complaint  Chief Complaint  Patient presents with  . Establish Care    NP     HPI  Pt. Is here to Establish care. Previous PCP was Dr. Rutherford Nail.  Past Medical History  Diagnosis Date  . Allergy   . Meningitis spinal 2015    In Coma for 2 weeks, in Hospital for 2 months.  Marland Kitchen GERD (gastroesophageal reflux disease)   . Arthritis     septic arthritis right knee    Past Surgical History  Procedure Laterality Date  . Knee surgery  07/2013  . Foot surgery  1978    History reviewed. No pertinent family history.  Social History   Social History  . Marital Status: Married    Spouse Name: N/A  . Number of Children: N/A  . Years of Education: N/A   Occupational History  . Not on file.   Social History Main Topics  . Smoking status: Current Every Day Smoker -- 0.50 packs/day    Types: Cigarettes  . Smokeless tobacco: Never Used  . Alcohol Use: 1.8 oz/week    3 Cans of beer per week  . Drug Use: No  . Sexual Activity:    Partners: Female   Other Topics Concern  . Not on file   Social History Narrative  . No narrative on file     Current outpatient prescriptions:  .  acetaminophen (TYLENOL) 500 MG tablet, Take 1,000 mg by mouth., Disp: , Rfl:  .  meclizine (ANTIVERT) 25 MG tablet, Take 25 mg by mouth., Disp: , Rfl:  .  Multiple Vitamin (MULTIVITAMIN WITH MINERALS) TABS tablet, Take 1 tablet by mouth daily., Disp: , Rfl:  .  vitamin B-12 (CYANOCOBALAMIN) 1000 MCG tablet, Take 1,000 mcg by mouth daily., Disp: , Rfl:   Allergies  Allergen Reactions  . Ceftriaxone Hives  . Meropenem     During hospitalization 2/22- 08/22/2013 patient had persistent fevers while on meropenem. 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 Known hives with ceftriaxone.     ROS   Objective  Filed Vitals:   03/23/15 1025  BP: 132/71  Pulse: 85  Temp: 98.5  F (36.9 C)  TempSrc: Oral  Resp: 19  Height: 6\' 4"  (1.93 m)  Weight: 238 lb 8 oz (108.183 kg)  SpO2: 97%    Physical Exam  Constitutional: He is oriented to person, place, and time and well-developed, well-nourished, and in no distress.  HENT:  Head: Normocephalic and atraumatic.  Cardiovascular: Normal rate and regular rhythm.   No murmur heard. Pulmonary/Chest: Effort normal and breath sounds normal. He has no wheezes. He has no rales.  Neurological: He is alert and oriented to person, place, and time.  Nursing note and vitals reviewed.  Assessment & Plan  1. 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 Taking meclizine as needed for vertigo.   Jolena Kittle Asad A[facility] 03/23/2015 6:43 PM

## 2015-04-25 ENCOUNTER — Encounter: Payer: Self-pay | Admitting: Family Medicine

## 2015-04-25 ENCOUNTER — Ambulatory Visit (INDEPENDENT_AMBULATORY_CARE_PROVIDER_SITE_OTHER): Payer: Medicaid Other | Admitting: Family Medicine

## 2015-04-25 VITALS — BP 118/80 | HR 95 | Temp 98.0°F | Resp 16 | Wt 242.0 lb

## 2015-04-25 DIAGNOSIS — Z0000 Encounter for general adult medical examination without abnormal findings: Secondary | ICD-10-CM

## 2015-04-25 NOTE — Progress Notes (Signed)
Name: Raymond Gibson   MRN: [id number]    DOB: 1960-12-29   Date:04/25/2015       Progress Note  Subjective  Chief Complaint  Chief Complaint  Patient presents with  . Annual Exam    HPI  Pt. Is here for a Complete Physical Exam.  Past Medical History  Diagnosis Date  . Allergy   . Meningitis spinal 2015    In Coma for 2 weeks, in Hospital for 2 months.  Marland Kitchen GERD (gastroesophageal reflux disease)   . Arthritis     septic arthritis right knee    Past Surgical History  Procedure Laterality Date  . Knee surgery  07/2013  . Foot surgery  1978    History reviewed. No pertinent family history.  Social History   Social History  . Marital Status: Married    Spouse Name: N/A  . Number of Children: N/A  . Years of Education: N/A   Occupational History  . Not on file.   Social History Main Topics  . Smoking status: Current Every Day Smoker -- 0.50 packs/day    Types: Cigarettes  . Smokeless tobacco: Never Used  . Alcohol Use: 1.8 oz/week    3 Cans of beer per week  . Drug Use: No  . Sexual Activity:    Partners: Female   Other Topics Concern  . Not on file   Social History Narrative     Current outpatient prescriptions:  .  acetaminophen (TYLENOL) 500 MG tablet, Take 1,000 mg by mouth., Disp: , Rfl:  .  esomeprazole (NEXIUM) 20 MG capsule, Take 20 mg by mouth., Disp: , Rfl:  .  meclizine (ANTIVERT) 25 MG tablet, Take 25 mg by mouth 3 (three) times daily as needed for dizziness., Disp: , Rfl:  .  Multiple Vitamin (MULTIVITAMIN WITH MINERALS) TABS tablet, Take 1 tablet by mouth daily., Disp: , Rfl:  .  vitamin B-12 (CYANOCOBALAMIN) 1000 MCG tablet, Take 1,000 mcg by mouth daily., Disp: , Rfl:   Allergies  Allergen Reactions  . Ceftriaxone Hives  . Meropenem     During hospitalization 2/22- 08/22/2013 patient had persistent fevers while on meropenem. 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 Known hives with ceftriaxone.    Review of Systems   Constitutional: Negative for fever, chills, weight loss and malaise/fatigue.  HENT: Positive for congestion, hearing loss and tinnitus (Had surgery behind the left ear. He cannot get the second surgery until he gets disability.). Negative for ear discharge, ear pain and sore throat.   Eyes: Negative for blurred vision, double vision and pain.  Respiratory: Negative for cough, shortness of breath and stridor.   Cardiovascular: Negative for chest pain, palpitations and leg swelling.  Gastrointestinal: Negative for heartburn, nausea, vomiting, abdominal pain, diarrhea, constipation, blood in stool and melena.  Genitourinary: Positive for frequency. Negative for dysuria and hematuria.  Musculoskeletal: Positive for joint pain. Negative for myalgias, back pain and neck pain.  Skin: Positive for itching (itching on palms of both hands). Negative for rash.  Neurological: Positive for dizziness. Negative for seizures, loss of consciousness and headaches.  Psychiatric/Behavioral: Positive for depression. The patient is nervous/anxious (used to feel anxious dealing with his left ear). The patient does not have insomnia.     Objective  Filed Vitals:   04/25/15 0918  BP: 118/80  Pulse: 95  Temp: 98 F (36.7 C)  TempSrc: Oral  Resp: 16  Weight: 242 lb (109.77 kg)  SpO2: 98%    Physical Exam  Constitutional: He is oriented to person, place, and time and well-developed, well-nourished, and in no distress.  HENT:  Head: Normocephalic and atraumatic.  Right Ear: External ear normal.  Left Ear: Tympanic membrane and external ear normal. No drainage or tenderness.  No middle ear effusion. Decreased hearing is noted.  Mouth/Throat: Oropharynx is clear and moist. No oropharyngeal exudate.  Surgical scar around the left ear.  Eyes: Conjunctivae are normal. Pupils are equal, round, and reactive to light.  Neck: Normal range of motion. Neck supple.  Cardiovascular: Normal rate, regular rhythm and  normal heart sounds.   No murmur heard. Pulmonary/Chest: Effort normal and breath sounds normal. He has no wheezes.  Abdominal: Soft. Bowel sounds are normal. There is no tenderness.  Genitourinary: Rectum normal and prostate normal. Rectal exam shows no external hemorrhoid.  Musculoskeletal:       Cervical back: Normal. He exhibits no tenderness and no pain.       Thoracic back: Normal. He exhibits no tenderness and no pain.       Lumbar back: Normal. He exhibits no tenderness and no pain.  Neurological: He is alert and oriented to person, place, and time.  Skin: Skin is warm and dry.  Psychiatric: Mood, memory, affect and judgment normal.  Nursing note and vitals reviewed.   Assessment & Plan  1. Annual physical exam Screening laboratory workup and referral to GI for screening colonoscopy.  - CBC with Differential - Comprehensive Metabolic Panel (CMET) - Vitamin D (25 hydroxy) - TSH - Lipid Profile - Ambulatory referral to Gastroenterology - PSA   Raymond Mijangos Asad A[facility] 04/25/2015 9:45 AM

## 2015-04-26 LAB — CBC WITH DIFFERENTIAL/PLATELET
BASOS: 1 %
Basophils Absolute: 0 10*3/uL (ref 0.0–0.2)
EOS (ABSOLUTE): 0.1 10*3/uL (ref 0.0–0.4)
EOS: 1 %
HEMATOCRIT: 46.6 % (ref 37.5–51.0)
HEMOGLOBIN: 16.2 g/dL (ref 12.6–17.7)
Immature Grans (Abs): 0 10*3/uL (ref 0.0–0.1)
Immature Granulocytes: 0 %
LYMPHS ABS: 2.7 10*3/uL (ref 0.7–3.1)
Lymphs: 33 %
MCH: 33.3 pg — ABNORMAL HIGH (ref 26.6–33.0)
MCHC: 34.8 g/dL (ref 31.5–35.7)
MCV: 96 fL (ref 79–97)
Monocytes Absolute: 0.8 10*3/uL (ref 0.1–0.9)
Monocytes: 10 %
NEUTROS ABS: 4.5 10*3/uL (ref 1.4–7.0)
Neutrophils: 55 %
Platelets: 316 10*3/uL (ref 150–379)
RBC: 4.87 x10E6/uL (ref 4.14–5.80)
RDW: 12.9 % (ref 12.3–15.4)
WBC: 8.2 10*3/uL (ref 3.4–10.8)

## 2015-04-26 LAB — COMPREHENSIVE METABOLIC PANEL
ALK PHOS: 80 IU/L (ref 39–117)
ALT: 21 IU/L (ref 0–44)
AST: 25 IU/L (ref 0–40)
Albumin/Globulin Ratio: 1.6 (ref 1.1–2.5)
Albumin: 4.3 g/dL (ref 3.5–5.5)
BUN/Creatinine Ratio: 12 (ref 9–20)
BUN: 15 mg/dL (ref 6–24)
Bilirubin Total: 0.3 mg/dL (ref 0.0–1.2)
CALCIUM: 10.1 mg/dL (ref 8.7–10.2)
CO2: 23 mmol/L (ref 18–29)
CREATININE: 1.25 mg/dL (ref 0.76–1.27)
Chloride: 105 mmol/L (ref 97–106)
GFR calc Af Amer: 74 mL/min/{1.73_m2} (ref >59)
GFR, EST NON AFRICAN AMERICAN: 64 mL/min/{1.73_m2} (ref >59)
GLOBULIN, TOTAL: 2.7 g/dL (ref 1.5–4.5)
Glucose: 108 mg/dL — ABNORMAL HIGH (ref 65–99)
POTASSIUM: 4.9 mmol/L (ref 3.5–5.2)
SODIUM: 145 mmol/L — AB (ref 136–144)
Total Protein: 7 g/dL (ref 6.0–8.5)

## 2015-04-26 LAB — LIPID PANEL
CHOLESTEROL TOTAL: 175 mg/dL (ref 100–199)
Chol/HDL Ratio: 2.8 ratio units (ref 0.0–5.0)
HDL: 62 mg/dL (ref >39)
LDL Calculated: 94 mg/dL (ref 0–99)
Triglycerides: 94 mg/dL (ref 0–149)
VLDL Cholesterol Cal: 19 mg/dL (ref 5–40)

## 2015-04-26 LAB — PSA: PROSTATE SPECIFIC AG, SERUM: 1.2 ng/mL (ref 0.0–4.0)

## 2015-04-26 LAB — TSH: TSH: 1.13 u[IU]/mL (ref 0.450–4.500)

## 2015-04-26 LAB — VITAMIN D 25 HYDROXY (VIT D DEFICIENCY, FRACTURES): VIT D 25 HYDROXY: 23.6 ng/mL — AB (ref 30.0–100.0)

## 2015-05-02 ENCOUNTER — Telehealth: Payer: Self-pay

## 2015-05-02 MED ORDER — VITAMIN D (ERGOCALCIFEROL) 1.25 MG (50000 UNIT) PO CAPS: 50000.0000 [IU] | ORAL_CAPSULE | ORAL | Status: DC

## 2015-05-02 NOTE — Telephone Encounter (Signed)
Medication has been sent to [facility] 

## 2015-05-09 ENCOUNTER — Telehealth: Payer: Self-pay | Admitting: Gastroenterology

## 2015-05-09 ENCOUNTER — Other Ambulatory Visit: Payer: Self-pay

## 2015-05-09 NOTE — Telephone Encounter (Signed)
Gastroenterology Pre-Procedure Review  Request Date: 05-22-2015 Requesting Physician: Dr.   PATIENT REVIEW QUESTIONS: The patient responded to the following health history questions as indicated:    1. Are you having any GI issues? no 2. Do you have a personal history of Polyps? no 3. Do you have a family history of Colon Cancer or Polyps? no 4. Diabetes Mellitus? no 5. Joint replacements in the past 12 months?no 6. Major health problems in the past 3 months?no 7. Any artificial heart valves, MVP, or defibrillator?no    MEDICATIONS & ALLERGIES:    Patient reports the following regarding taking any anticoagulation/antiplatelet therapy:   Plavix, Coumadin, Eliquis, Xarelto, Lovenox, Pradaxa, Brilinta, or Effient? no Aspirin? no  Patient confirms/reports the following medications:  Current Outpatient Prescriptions  Medication Sig Dispense Refill   acetaminophen (TYLENOL) 500 MG tablet Take 1,000 mg by mouth.     esomeprazole (NEXIUM) 20 MG capsule Take 20 mg by mouth.     meclizine (ANTIVERT) 25 MG tablet Take 25 mg by mouth 3 (three) times daily as needed for dizziness.     Multiple Vitamin (MULTIVITAMIN WITH MINERALS) TABS tablet Take 1 tablet by mouth daily.     vitamin B-12 (CYANOCOBALAMIN) 1000 MCG tablet Take 1,000 mcg by mouth daily.     Vitamin D, Ergocalciferol, (DRISDOL) 50000 UNITS CAPS capsule Take 1 capsule (50,000 Units total) by mouth once a week. 12 capsule 0   No current facility-administered medications for this visit.    Patient confirms/reports the following allergies:  Allergies  Allergen Reactions   Ceftriaxone Hives   Meropenem     During hospitalization 2/22- 08/22/2013 patient had persistent fevers while on meropenem. 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 Known hives with ceftriaxone.    No orders of the defined types were placed in this encounter.    AUTHORIZATION INFORMATION Primary Insurance: 1D#: Group #:  Secondary  Insurance: 1D#: Group #:  SCHEDULE INFORMATION: Date: 12-20-2016Time: Location:[facility]

## 2015-05-22 ENCOUNTER — Ambulatory Visit
Admission: RE | Admit: 2015-05-22 | Discharge: 2015-05-22 | Disposition: A | Discharge status: Home / Self Care | Payer: Medicaid Other | Source: Ambulatory Visit | Attending: Gastroenterology | Admitting: Gastroenterology

## 2015-05-22 ENCOUNTER — Encounter: Payer: Self-pay | Admitting: *Deleted

## 2015-05-22 ENCOUNTER — Encounter
Admission: RE | Disposition: A | Discharge status: Home / Self Care | Payer: Self-pay | Source: Ambulatory Visit | Attending: Gastroenterology

## 2015-05-22 ENCOUNTER — Ambulatory Visit: Payer: Medicaid Other | Admitting: Anesthesiology

## 2015-05-22 DIAGNOSIS — F17210 Nicotine dependence, cigarettes, uncomplicated: Secondary | ICD-10-CM | POA: Diagnosis not present

## 2015-05-22 DIAGNOSIS — D125 Benign neoplasm of sigmoid colon: Secondary | ICD-10-CM | POA: Insufficient documentation

## 2015-05-22 DIAGNOSIS — M1990 Unspecified osteoarthritis, unspecified site: Secondary | ICD-10-CM | POA: Diagnosis not present

## 2015-05-22 DIAGNOSIS — Z1211 Encounter for screening for malignant neoplasm of colon: Secondary | ICD-10-CM | POA: Diagnosis not present

## 2015-05-22 DIAGNOSIS — K5730 Diverticulosis of large intestine without perforation or abscess without bleeding: Secondary | ICD-10-CM | POA: Insufficient documentation

## 2015-05-22 DIAGNOSIS — Z79899 Other long term (current) drug therapy: Secondary | ICD-10-CM | POA: Diagnosis not present

## 2015-05-22 DIAGNOSIS — K642 Third degree hemorrhoids: Secondary | ICD-10-CM | POA: Diagnosis not present

## 2015-05-22 DIAGNOSIS — K219 Gastro-esophageal reflux disease without esophagitis: Secondary | ICD-10-CM | POA: Insufficient documentation

## 2015-05-22 DIAGNOSIS — D123 Benign neoplasm of transverse colon: Secondary | ICD-10-CM | POA: Insufficient documentation

## 2015-05-22 HISTORY — PX: COLONOSCOPY WITH PROPOFOL: SHX5780

## 2015-05-22 SURGERY — COLONOSCOPY WITH PROPOFOL
Anesthesia: General

## 2015-05-22 MED ORDER — PROPOFOL 10 MG/ML IV BOLUS
INTRAVENOUS | Status: DC | PRN
  Administered 2015-05-22 (×2): 20 mg via INTRAVENOUS

## 2015-05-22 MED ORDER — SODIUM CHLORIDE 0.9 % IV SOLN
INTRAVENOUS | Status: DC
  Administered 2015-05-22: 10:00:00 via INTRAVENOUS

## 2015-05-22 MED ORDER — FENTANYL CITRATE (PF) 100 MCG/2ML IJ SOLN
INTRAMUSCULAR | Status: DC | PRN
  Administered 2015-05-22 (×2): 50 ug via INTRAVENOUS

## 2015-05-22 MED ORDER — MIDAZOLAM HCL 2 MG/2ML IJ SOLN
INTRAMUSCULAR | Status: DC | PRN
  Administered 2015-05-22: 2 mg via INTRAVENOUS
  Administered 2015-05-22: 1 mg via INTRAVENOUS
  Administered 2015-05-22: 2 mg via INTRAVENOUS

## 2015-05-22 NOTE — Op Note (Signed)
[facility] Gastroenterology Patient Name: Raymond Gibson Procedure Date: 05/22/2015 10:22 AM MRN: [id number] Account #: [id number] Date of Birth: Nov 29, 1960 Admit Type: Outpatient Age: 54 Room: [facility] ENDO ROOM 4 Gender: Male Note Status: Finalized Procedure:         Colonoscopy Indications:       Screening for colorectal malignant neoplasm Providers:         Lucilla Lame, MD Referring MD:      Otila Back. Manuella Ghazi (Referring MD) Medicines:         Propofol per Anesthesia Complications:     No immediate complications. Procedure:         Pre-Anesthesia Assessment:                    - Prior to the procedure, a History and Physical was                     performed, and patient medications and allergies were                     reviewed. The patient's tolerance of previous anesthesia                     was also reviewed. The risks and benefits of the procedure                     and the sedation options and risks were discussed with the                     patient. All questions were answered, and informed consent                     was obtained. Prior Anticoagulants: The patient has taken                     no previous anticoagulant or antiplatelet agents. ASA                     Grade Assessment: II - A patient with mild systemic                     disease. After reviewing the risks and benefits, the                     patient was deemed in satisfactory condition to undergo                     the procedure.                    After obtaining informed consent, the colonoscope was                     passed under direct vision. Throughout the procedure, the                     patient's blood pressure, pulse, and oxygen saturations                     were monitored continuously. The Olympus CF-H180AL                     colonoscope ( S#: [id number] ) was introduced through the  anus and advanced to the the cecum, identified by   appendiceal orifice and ileocecal valve. The colonoscopy                     was performed without difficulty. The patient tolerated                     the procedure well. The quality of the bowel preparation                     was fair. Findings:      The perianal and digital rectal examinations were normal.      Four sessile polyps were found in the transverse colon. The polyps were       5 to 8 mm in size. These polyps were removed with a cold snare.       Resection and retrieval were complete.      A 4 mm polyp was found in the sigmoid colon. The polyp was sessile. The       polyp was removed with a cold snare. Resection and retrieval were       complete.      Multiple small-mouthed diverticula were found in the sigmoid colon.      Non-bleeding internal hemorrhoids were found during retroflexion. The       hemorrhoids were Grade III (internal hemorrhoids that prolapse but       require manual reduction). Impression:        - Four 5 to 8 mm polyps in the transverse colon. Resected                     and retrieved.                    - One 4 mm polyp in the sigmoid colon. Resected and                     retrieved.                    - Diverticulosis in the sigmoid colon.                    - Non-bleeding internal hemorrhoids. Recommendation:    - Repeat colonoscopy in 5 years if polyp adenoma and 10                     years if hyperplastic Procedure Code(s): --- Professional ---                    [id number], Colonoscopy, flexible; with removal of tumor(s),                     polyp(s), or other lesion(s) by snare technique Diagnosis Code(s): --- Professional ---                    Z12.11, Encounter for screening for malignant neoplasm of                     colon                    D12.3, Benign neoplasm of transverse colon                    D12.5, Benign neoplasm of sigmoid colon CPT copyright 2014 American Medical Association. All  rights reserved. The codes documented in this  report are preliminary and upon coder review may  be revised to meet current compliance requirements. Lucilla Lame, MD 05/22/2015 10:47:02 AM This report has been signed electronically. Number of Addenda: 0 Note Initiated On: 05/22/2015 10:22 AM Scope Withdrawal Time: 0 hours 11 minutes 12 seconds  Total Procedure Duration: 0 hours 15 minutes 27 seconds       [facility]

## 2015-05-22 NOTE — Anesthesia Preprocedure Evaluation (Signed)
Anesthesia Evaluation  Patient identified by MRN, date of birth, ID band Patient awake    Reviewed: Allergy & Precautions, NPO status , Patient's Chart, lab work & pertinent test results, reviewed documented beta blocker date and time   Airway Mallampati: II  TM Distance: >3 FB     Dental  (+) Chipped   Pulmonary Current Smoker,           Cardiovascular      Neuro/Psych    GI/Hepatic GERD  Controlled and Medicated,  Endo/Other    Renal/GU      Musculoskeletal   Abdominal   Peds  Hematology   Anesthesia Other Findings   Reproductive/Obstetrics                             Anesthesia Physical Anesthesia Plan  ASA: III  Anesthesia Plan: General   Post-op Pain Management:    Induction: Intravenous  Airway Management Planned: Nasal Cannula  Additional Equipment:   Intra-op Plan:   Post-operative Plan:   Informed Consent: I have reviewed the patients History and Physical, chart, labs and discussed the procedure including the risks, benefits and alternatives for the proposed anesthesia with the patient or authorized representative who has indicated his/her understanding and acceptance.     Plan Discussed with: CRNA  Anesthesia Plan Comments:         Anesthesia Quick Evaluation

## 2015-05-22 NOTE — H&P (Signed)
  [facility]  [address], [place], [place] [address] Phone: [phone] Fax : [phone]  Primary Care Physician:  Keith Rake, MD Primary Gastroenterologist:  Dr. Allen Norris  Pre-Procedure History & Physical: HPI:  Raymond Gibson is a 54 y.o. male is here for a screening colonoscopy.   Past Medical History  Diagnosis Date  . Allergy   . Meningitis spinal 2015    In Coma for 2 weeks, in Hospital for 2 months.  Marland Kitchen GERD (gastroesophageal reflux disease)   . Arthritis     septic arthritis right knee    Past Surgical History  Procedure Laterality Date  . Knee surgery  07/2013  . Foot surgery  1978    Prior to Admission medications   Medication Sig Start Date End Date Taking? Authorizing Provider  meclizine (ANTIVERT) 25 MG tablet Take 25 mg by mouth 3 (three) times daily as needed for dizziness.   Yes Historical Provider, MD  acetaminophen (TYLENOL) 500 MG tablet Take 1,000 mg by mouth.    Historical Provider, MD  esomeprazole (NEXIUM) 20 MG capsule Take 20 mg by mouth.    Historical Provider, MD  Multiple Vitamin (MULTIVITAMIN WITH MINERALS) TABS tablet Take 1 tablet by mouth daily.    Historical Provider, MD  vitamin B-12 (CYANOCOBALAMIN) 1000 MCG tablet Take 1,000 mcg by mouth daily.    Historical Provider, MD  Vitamin D, Ergocalciferol, (DRISDOL) 50000 UNITS CAPS capsule Take 1 capsule (50,000 Units total) by mouth once a week. 05/02/15   Roselee Nova, MD    Allergies as of 05/09/2015 - Review Complete 04/25/2015  Allergen Reaction Noted  . Ceftriaxone Hives 03/23/2015  . Meropenem  03/23/2015    History reviewed. No pertinent family history.  Social History   Social History  . Marital Status: Married    Spouse Name: N/A  . Number of Children: N/A  . Years of Education: N/A   Occupational History  . Not on file.   Social History Main Topics  . Smoking status: Current Every Day Smoker -- 0.50 packs/day    Types: Cigarettes  .  Smokeless tobacco: Never Used  . Alcohol Use: 1.8 oz/week    3 Cans of beer per week  . Drug Use: No  . Sexual Activity:    Partners: Female   Other Topics Concern  . Not on file   Social History Narrative    Review of Systems: See HPI, otherwise negative ROS  Physical Exam: BP 128/80 mmHg  Pulse 84  Temp(Src) 97.8 F (36.6 C) (Tympanic)  Resp 20  Ht 6\' 4"  (1.93 m)  Wt 242 lb (109.77 kg)  BMI 29.47 kg/m2  SpO2 100% General:   Alert,  pleasant and cooperative in NAD Head:  Normocephalic and atraumatic. Neck:  Supple; no masses or thyromegaly. Lungs:  Clear throughout to auscultation.    Heart:  Regular rate and rhythm. Abdomen:  Soft, nontender and nondistended. Normal bowel sounds, without guarding, and without rebound.   Neurologic:  Alert and  oriented x4;  grossly normal neurologically.  Impression/Plan: Raymond Gibson is now here to undergo a screening colonoscopy.  Risks, benefits, and alternatives regarding colonoscopy have been reviewed with the patient.  Questions have been answered.  All parties agreeable.

## 2015-05-22 NOTE — Transfer of Care (Signed)
Immediate Anesthesia Transfer of Care Note  Patient: Raymond Gibson  Procedure(s) Performed: Procedure(s): COLONOSCOPY WITH PROPOFOL (N/A)  Patient Location: PACU  Anesthesia Type:General  Level of Consciousness: awake, alert  and oriented  Airway & Oxygen Therapy: Patient Spontanous Breathing and Patient connected to nasal cannula oxygen  Post-op Assessment: Report given to RN and Post -op Vital signs reviewed and stable  Post vital signs: Reviewed and stable  Last Vitals:  Filed Vitals:   05/22/15 0927  BP: 128/80  Pulse: 84  Temp: 36.6 C  Resp: 20    Complications: No apparent anesthesia complications

## 2015-05-22 NOTE — Anesthesia Postprocedure Evaluation (Signed)
Anesthesia Post Note  Patient: Raymond Gibson  Procedure(s) Performed: Procedure(s) (LRB): COLONOSCOPY WITH PROPOFOL (N/A)  Patient location during evaluation: Endoscopy Anesthesia Type: General Level of consciousness: awake Pain management: pain level controlled Vital Signs Assessment: post-procedure vital signs reviewed and stable Respiratory status: spontaneous breathing Cardiovascular status: blood pressure returned to baseline Anesthetic complications: no    Last Vitals:  Filed Vitals:   05/22/15 1110 05/22/15 1120  BP: 130/97 130/98  Pulse:    Temp:    Resp:      Last Pain:  Filed Vitals:   05/22/15 1149  PainSc: 0-No pain                 Zoei Amison S

## 2015-05-23 LAB — SURGICAL PATHOLOGY

## 2015-05-24 ENCOUNTER — Encounter: Payer: Self-pay | Admitting: Gastroenterology

## 2015-05-29 NOTE — Addendum Note (Signed)
Addendum  created 05/29/15 0831 by Gunnar Bulla, MD   Modules edited: Anesthesia Attestations

## 2015-08-30 ENCOUNTER — Ambulatory Visit (INDEPENDENT_AMBULATORY_CARE_PROVIDER_SITE_OTHER): Payer: Medicaid Other | Admitting: Family Medicine

## 2015-08-30 ENCOUNTER — Encounter: Payer: Self-pay | Admitting: Family Medicine

## 2015-08-30 VITALS — BP 128/73 | HR 96 | Temp 98.7°F | Resp 15 | Ht 76.0 in | Wt 243.0 lb

## 2015-08-30 DIAGNOSIS — N529 Male erectile dysfunction, unspecified: Secondary | ICD-10-CM | POA: Insufficient documentation

## 2015-08-30 DIAGNOSIS — E559 Vitamin D deficiency, unspecified: Secondary | ICD-10-CM

## 2015-08-30 MED ORDER — SILDENAFIL CITRATE 50 MG PO TABS: 50.0000 mg | ORAL_TABLET | Freq: Every day | ORAL | Status: DC | PRN

## 2015-08-30 NOTE — Progress Notes (Signed)
Name: Raymond Gibson   MRN: [id number]    DOB: 26-Sep-1960   Date:08/30/2015       Progress Note  Subjective  Chief Complaint  Chief Complaint  Patient presents with  . Follow-up    Discuss Meds    HPI  Vitamin D insufficiency: Pt. Returns to repeat his Vitamin D levels. Levels obtained on 11/23 were below normal. He was subsequently started on Vitamin D 50,000 units once weekly x 12 weeks. He has finished the medication and now returns to repeat his levels.  Erectile Dysfunction: Pt. Reports trouble achieving an erection, and once achieved, has difficulty maintaining an erection. No trouble urinating at night and burning on urination. Sex drive is there but erectile dysfunction is affecting his relationship.    Past Medical History  Diagnosis Date  . Allergy   . Meningitis spinal 2015    In Coma for 2 weeks, in Hospital for 2 months.  . Pneumonia     HX OF  . 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 Chronic cough     due to allergies  . Swelling     of legs and feet  . Arthritis     septic arthritis right knee  . Full dentures   . GERD (gastroesophageal reflux disease)   . Complication of anesthesia     During ear pt stated that when anesthesia went into small vein in hand,her arm felt like it was on fire.  [name]     vertigo, dizziness    Past Surgical History  Procedure Laterality Date  . Knee surgery  07/2013  . Foot surgery  1978  . Colonoscopy with propofol N/A 05/22/2015    Procedure: COLONOSCOPY WITH PROPOFOL;  Surgeon: Lucilla Lame, MD;  Location: [facility] ENDOSCOPY;  Service: Endoscopy;  Laterality: N/A;  . Inner ear surgery    . Elbow surgery      REMOVAL OF ELBOW SPUR  . Tonsillectomy      History reviewed. No pertinent family history.  Social History   Social History  . Marital Status: Married    Spouse Name: N/A  . Number of Children: N/A  . Years of Education: N/A   Occupational History  . Not on file.   Social  History Main Topics  . Smoking status: Current Every Day Smoker -- 1.00 packs/day for 6 years    Types: Cigarettes  . Smokeless tobacco: Never Used  . Alcohol Use: 3.0 oz/week    5 Cans of beer per week  . Drug Use: No  . Sexual Activity:    Partners: Female   Other Topics Concern  . Not on file   Social History Narrative     Current outpatient prescriptions:  .  acetaminophen (TYLENOL) 500 MG tablet, Take 1,000 mg by mouth 2 (two) times daily. am, Disp: , Rfl:  .  esomeprazole (NEXIUM) 20 MG capsule, Take 20 mg by mouth as needed. , Disp: , Rfl:  .  guaifenesin (HUMIBID E) 400 MG TABS tablet, Take 400 mg by mouth daily. , Disp: , Rfl:  .  meclizine (ANTIVERT) 25 MG tablet, Take 25 mg by mouth 2 (two) times daily. Reported on 08/29/2015/am and pm if needed, Disp: , Rfl:  .  Multiple Vitamin (MULTIVITAMIN WITH MINERALS) TABS tablet, Take 1 tablet by mouth daily. Reported on 08/30/2015, Disp: , Rfl:  .  vitamin B-12 (CYANOCOBALAMIN) 1000 MCG tablet, Take 1,000 mcg by mouth  daily. Reported on 08/30/2015, Disp: , Rfl:  .  Vitamin D, Ergocalciferol, (DRISDOL) 50000 UNITS CAPS capsule, Take 1 capsule (50,000 Units total) by mouth once a week. (Patient not taking: Reported on 08/30/2015), Disp: 12 capsule, Rfl: 0  Allergies  Allergen Reactions  . Ceftriaxone Hives  . Meropenem     During hospitalization 2/22- 08/22/2013 patient had persistent fevers while on meropenem. 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 Known hives with ceftriaxone.     Review of Systems  Constitutional: Negative for weight loss and malaise/fatigue.  Musculoskeletal: Negative for myalgias, back pain and joint pain.      Objective  Filed Vitals:   08/30/15 0852  BP: 128/73  Pulse: 96  Temp: 98.7 F (37.1 C)  TempSrc: Oral  Resp: 15  Height: 6\' 4"  (1.93 m)  Weight: 243 lb (110.224 kg)  SpO2: 96%    Physical Exam  Constitutional: He is well-developed, well-nourished, and in no distress.  HENT:   Head: Normocephalic and atraumatic.  Cardiovascular: Normal rate and regular rhythm.   Pulmonary/Chest: Effort normal and breath sounds normal.  Genitourinary: Testes/scrotum normal and penis normal. Penis exhibits no lesions.  Circumcised penis, normal scrotum, no lesions identified.  Nursing note and vitals reviewed.     Assessment & Plan  1. Vitamin D insufficiency  - Vitamin D (25 hydroxy)  2. Erectile dysfunction, unspecified erectile dysfunction type We'll start on Viagra 50 mg to be taken 30 minutes prior to sexual activity. Advised on possible adverse effects and return in 6 weeks for follow-up. - sildenafil (VIAGRA) 50 MG tablet; Take 1 tablet (50 mg total) by mouth daily as needed for erectile dysfunction. 1 tab PO 30 mins prior to sexual activity  Dispense: 10 tablet; Refill: 0    Rafiel Mecca Asad A. [facility] 08/30/2015 9:04 AM

## 2015-08-31 LAB — VITAMIN D 25 HYDROXY (VIT D DEFICIENCY, FRACTURES): VIT D 25 HYDROXY: 46 ng/mL (ref 30.0–100.0)

## 2015-09-04 ENCOUNTER — Encounter
Admission: RE | Disposition: A | Discharge status: Home / Self Care | Payer: Self-pay | Source: Ambulatory Visit | Attending: Ophthalmology

## 2015-09-04 ENCOUNTER — Ambulatory Visit
Admission: RE | Admit: 2015-09-04 | Discharge: 2015-09-04 | Disposition: A | Discharge status: Home / Self Care | Payer: Medicaid Other | Source: Ambulatory Visit | Attending: Ophthalmology | Admitting: Ophthalmology

## 2015-09-04 ENCOUNTER — Ambulatory Visit: Payer: Medicaid Other | Admitting: Anesthesiology

## 2015-09-04 DIAGNOSIS — M1990 Unspecified osteoarthritis, unspecified site: Secondary | ICD-10-CM | POA: Diagnosis not present

## 2015-09-04 DIAGNOSIS — D229 Melanocytic nevi, unspecified: Secondary | ICD-10-CM | POA: Insufficient documentation

## 2015-09-04 DIAGNOSIS — F17200 Nicotine dependence, unspecified, uncomplicated: Secondary | ICD-10-CM | POA: Insufficient documentation

## 2015-09-04 DIAGNOSIS — Z8619 Personal history of other infectious and parasitic diseases: Secondary | ICD-10-CM | POA: Insufficient documentation

## 2015-09-04 DIAGNOSIS — R05 Cough: Secondary | ICD-10-CM | POA: Insufficient documentation

## 2015-09-04 DIAGNOSIS — M7989 Other specified soft tissue disorders: Secondary | ICD-10-CM | POA: Diagnosis not present

## 2015-09-04 DIAGNOSIS — H9190 Unspecified hearing loss, unspecified ear: Secondary | ICD-10-CM | POA: Diagnosis not present

## 2015-09-04 DIAGNOSIS — K219 Gastro-esophageal reflux disease without esophagitis: Secondary | ICD-10-CM | POA: Diagnosis not present

## 2015-09-04 DIAGNOSIS — Z79899 Other long term (current) drug therapy: Secondary | ICD-10-CM | POA: Diagnosis not present

## 2015-09-04 DIAGNOSIS — H02401 Unspecified ptosis of right eyelid: Secondary | ICD-10-CM | POA: Insufficient documentation

## 2015-09-04 HISTORY — DX: Pneumonia, unspecified organism: J18.9

## 2015-09-04 HISTORY — PX: PTOSIS REPAIR: SHX6568

## 2015-09-04 HISTORY — DX: Unspecified sensorineural hearing loss: H90.5

## 2015-09-04 HISTORY — DX: Complete loss of teeth, unspecified cause, unspecified class: K08.109

## 2015-09-04 HISTORY — DX: Cough: R05

## 2015-09-04 HISTORY — DX: Chronic cough: R05.3

## 2015-09-04 HISTORY — DX: Adverse effect of unspecified anesthetic, initial encounter: T41.45XA

## 2015-09-04 HISTORY — DX: Edema, unspecified: R60.9

## 2015-09-04 HISTORY — DX: Presence of dental prosthetic device (complete) (partial): Z97.2

## 2015-09-04 HISTORY — DX: Other complications of anesthesia, initial encounter: T88.59XA

## 2015-09-04 HISTORY — DX: Unspecified hearing loss, unspecified ear: H91.90

## 2015-09-04 SURGERY — REPAIR, BLEPHAROPTOSIS
Anesthesia: Monitor Anesthesia Care | Site: Eye | Laterality: Right | Wound class: Clean

## 2015-09-04 MED ORDER — PROPOFOL 10 MG/ML IV BOLUS
INTRAVENOUS | Status: DC | PRN
  Administered 2015-09-04: 200 mg via INTRAVENOUS

## 2015-09-04 MED ORDER — GLYCOPYRROLATE 0.2 MG/ML IJ SOLN
INTRAMUSCULAR | Status: DC | PRN
  Administered 2015-09-04: 0.2 mg via INTRAVENOUS

## 2015-09-04 MED ORDER — MIDAZOLAM HCL 5 MG/5ML IJ SOLN
INTRAMUSCULAR | Status: DC | PRN
  Administered 2015-09-04: 2 mg via INTRAVENOUS

## 2015-09-04 MED ORDER — ERYTHROMYCIN 5 MG/GM OP OINT: TOPICAL_OINTMENT | OPHTHALMIC | Status: DC

## 2015-09-04 MED ORDER — OXYCODONE-ACETAMINOPHEN 5-325 MG PO TABS: 1.0000 | ORAL_TABLET | ORAL | Status: DC | PRN

## 2015-09-04 MED ORDER — LIDOCAINE-EPINEPHRINE 2 %-1:100000 IJ SOLN
INTRAMUSCULAR | Status: DC | PRN
  Administered 2015-09-04: 3 mL via OPHTHALMIC

## 2015-09-04 MED ORDER — LACTATED RINGERS IV SOLN
INTRAVENOUS | Status: DC | PRN
  Administered 2015-09-04 (×2): via INTRAVENOUS

## 2015-09-04 MED ORDER — TETRACAINE HCL 0.5 % OP SOLN
OPHTHALMIC | Status: DC | PRN
  Administered 2015-09-04: 3 [drp] via OPHTHALMIC

## 2015-09-04 MED ORDER — FENTANYL CITRATE (PF) 100 MCG/2ML IJ SOLN
INTRAMUSCULAR | Status: DC | PRN
  Administered 2015-09-04: 100 ug via INTRAVENOUS

## 2015-09-04 MED ORDER — BSS IO SOLN
INTRAOCULAR | Status: DC | PRN
  Administered 2015-09-04: 15 mL via INTRAOCULAR

## 2015-09-04 MED ORDER — ONDANSETRON HCL 4 MG/2ML IJ SOLN
INTRAMUSCULAR | Status: DC | PRN
Start: 2015-09-04 — End: 2015-09-04
  Administered 2015-09-04: 4 mg via INTRAVENOUS

## 2015-09-04 MED ORDER — ERYTHROMYCIN 5 MG/GM OP OINT
TOPICAL_OINTMENT | OPHTHALMIC | Status: DC | PRN
  Administered 2015-09-04: 1 via OPHTHALMIC

## 2015-09-04 SURGICAL SUPPLY — 35 items
APPLICATOR COTTON TIP WD 3 STR (MISCELLANEOUS) ×4 IMPLANT
BLADE SURG 15 STRL LF DISP TIS (BLADE) ×1 IMPLANT
BLADE SURG 15 STRL SS (BLADE) ×1
CORD BIP STRL DISP 12FT (MISCELLANEOUS) ×2 IMPLANT
DRAPE HEAD BAR (DRAPES) ×2 IMPLANT
GAUZE SPONGE 4X4 12PLY STRL (GAUZE/BANDAGES/DRESSINGS) ×2 IMPLANT
GAUZE SPONGE NON-WVN 2X2 STRL (MISCELLANEOUS) ×10 IMPLANT
GLOVE SURG LX 7.0 MICRO (GLOVE) ×2
GLOVE SURG LX STRL 7.0 MICRO (GLOVE) ×2 IMPLANT
MARKER SKIN XFINE TIP W/RULER (MISCELLANEOUS) ×2 IMPLANT
NEEDLE FILTER BLUNT 18X 1/2SAF (NEEDLE) ×1
NEEDLE FILTER BLUNT 18X1 1/2 (NEEDLE) ×1 IMPLANT
NEEDLE HYPO 30X.5 LL (NEEDLE) ×4 IMPLANT
PACK DRAPE NASAL/ENT (PACKS) ×2 IMPLANT
SOL PREP PVP 2OZ (MISCELLANEOUS) ×2
SOLUTION PREP PVP 2OZ (MISCELLANEOUS) ×1 IMPLANT
SPONGE VERSALON 2X2 STRL (MISCELLANEOUS) ×10
SUT CHROMIC 4-0 (SUTURE)
SUT CHROMIC 4-0 M2 12X2 ARM (SUTURE)
SUT CHROMIC 5 0 P 3 (SUTURE) IMPLANT
SUT ETHILON 4 0 CL P 3 (SUTURE) IMPLANT
SUT MERSILENE 4-0 S-2 (SUTURE) IMPLANT
SUT PDS AB 4-0 P3 18 (SUTURE) IMPLANT
SUT PLAIN GUT (SUTURE) ×4 IMPLANT
SUT PROLENE 5 0 P 3 (SUTURE) ×4 IMPLANT
SUT PROLENE 6 0 P 1 18 (SUTURE) ×2 IMPLANT
SUT SILK 4 0 G 3 (SUTURE) IMPLANT
SUT VIC AB 5-0 P-3 18X BRD (SUTURE) IMPLANT
SUT VIC AB 5-0 P3 18 (SUTURE)
SUT VICRYL 7 0 TG140 8 (SUTURE) IMPLANT
SUTURE CHRMC 4-0 M2 12X2 ARM (SUTURE) IMPLANT
SYR 3ML LL SCALE MARK (SYRINGE) ×2 IMPLANT
SYRINGE 10CC LL (SYRINGE) ×2 IMPLANT
WATER STERILE IRR 500ML POUR (IV SOLUTION) ×2 IMPLANT
frontalis suspension kit ×2 IMPLANT

## 2015-09-04 NOTE — Anesthesia Postprocedure Evaluation (Signed)
Anesthesia Post Note  Patient: Raymond Gibson  Procedure(s) Performed: Procedure(s) (LRB): ptosis repair right eye with biopsy of conjunctiva (Right)  Patient location during evaluation: PACU Anesthesia Type: General Level of consciousness: awake and alert Pain management: pain level controlled Vital Signs Assessment: post-procedure vital signs reviewed and stable Respiratory status: spontaneous breathing and respiratory function stable Cardiovascular status: stable Anesthetic complications: no    Jaci Standard, III,  Kaiyu Mirabal D

## 2015-09-04 NOTE — Op Note (Signed)
Preoperative Diagnosis:  1.  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  Pigmented lesion right caruncle  Postoperative Diagnosis:  Same.  Procedure(s) Performed:   1.  Blepharoptosis repair with Frontalis sling right Upper Eyelid(s) 2.  Conjunctival biopsy right caruncle   Teaching Surgeon: Philis Pique. Vickki Muff, M.D.  Assistants: none  Anesthesia: General LMA  Specimens: 1 mm darkly pigmented conjunctival lesion sent in formalin   Estimated Blood Loss: Minimal.  Complications: None.  Operative Findings: None Dictated  PROCEDURE:  Allergies were reviewed and the patient is allergic to ceftriaxone and meropenem..   After the risks, benefits, complications and alternatives were discussed with the patient, appropriate informed consent was obtained. While seated in an upright position and looking in primary gaze, the mid pupillary line was marked on the upper eyelid margins bilaterally. The patient was then brought to the operating suite and reclined supine.  Timeout was conducted and General LMA anesthesia was induced without difficulty.  Local anesthetic consisting of a 50-50 mixture 2% lidocaine with epinephrine and 0.75% bupivacaine with added Hylenex was injected subcutaneously to the right upper eyelid(s) and the 3 stab incision sites above the brow. Additional local was injected subconjunctivally to the right caruncle After adequate local was instilled, the patient was prepped and draped in the usual sterile fashion for eyelid surgery.   Attention was turned to the right caruncle. Westcott scissors were used to excise the 1 mm darkly pigmented lesion. Hemostasis was obtained with bipolar cautery. The lesion was then handed off and sent in formalin for pathologic evaluation.  Attention was turned to the upper eyelids. A 54mm upper eyelid crease incision line was marked with calipers on right upper eyelid(s).  Attention was turned to the  right upper eyelid. A  #15 blade was used to open the premarked incision line and hemostasis was obtained with bipolar cautery. Westcott scissors were then used to transect through orbicularis for the length of the incision down to the tarsal plate. 15 blade was also used to create the 3 premarked stab incisions above the brow down to the periosteum. Epitarsus was dissected to create a smooth surface to suture to. A Seiff rod was then sutured to the tarsal plate with 3 interrupted 6-0 Prolene sutures were then passed partial thickness through the tarsal plates of the right upper eyelid(s). These sutures were placed in line with the mid pupillary, medial limbal, and lateral limbal lines.  A Jaeger lid plate was used to protect the globe. A Wright needle was then passed from the medial stab incision beneath the frontalis and orbicularis and emerged at the level of the tarsal plate. The first end of the [facility] rod was threaded through this tract.  The second and of the rod was passed through the lateral stab incision in a similar manner. Both ends were then passed to the superior stab incision in a similar manner.  The eyelid incision line was then closed with a running 6-0 fast absorbing plain gut suture.  A Watzke sleeve was then passed over each end of the [facility] rod so that the ends crossed.  The tension on the rod was then adjusted until a nice lid height and contour were achieved.   The ends of the rod were trimmed to approximately 1 cm in length. A 6-0 Prolene suture was passed around the Watzke sleeve secured into place. The ends of the rod and the sleeve were bluntly placed under the frontalis muscle.  The  remaining skin incisions were  closed witinterrupted0 fast absorbing plain sutures. The patient tolerated the procedure well. Erythromycin ophthalmic ointment was applied to the incision site(s) followed by ice packs. The patient was taken to the recovery area where he recovered without difficulty.  Post-Op Plan/Instructions:   Mr. Befort was instructed to use ice packs frequently for the next 48 hours. His was instructed to use erythromycin ophthalmic ointment on his incisions 4 times a day for the next 12 to 14 days. He was given a prescription for Percocet for pain control should Tylenol not be effective. He was asked to to follow up in 2 weeks' time at the [facility] in [place], [place] or sooner as needed for problems.  Johnpatrick Jenny M. Vickki Muff, M.D. Attending,Ophthalmology

## 2015-09-04 NOTE — Interval H&P Note (Signed)
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 After consideration of risks, benefits and other options for treatment, the patient has consented to  Procedure(s) with comments: [place] (Right) - WITH LMA VERY HARD OF HEARING/SPEAK SLOWLY SPECIAL EQUIPMENT:   FRONTALIS SLING INSTRUMENTS SELFF ROD as a surgical intervention .  The patient's history has been reviewed, patient examined, no change in status, stable for surgery.  I have reviewed the patient's chart and labs.  Questions were answered to the patient's satisfaction.     Vickki Muff, Uilani Sanville M

## 2015-09-04 NOTE — Anesthesia Procedure Notes (Signed)
Procedure Name: LMA Insertion Performed by: Nat Christen Pre-anesthesia Checklist: Patient identified, Patient being monitored, Timeout performed, Emergency Drugs available and Suction available Patient Re-evaluated:Patient Re-evaluated prior to inductionOxygen Delivery Method: Circle system utilized Preoxygenation: Pre-oxygenation with 100% oxygen Intubation Type: IV induction Ventilation: Mask ventilation without difficulty LMA: LMA inserted LMA Size: 4.0 Tube type: Oral Number of attempts: 1 Placement Confirmation: positive ETCO2 and breath sounds checked- equal and bilateral Tube secured with: Tape Dental Injury: Teeth and Oropharynx as per pre-operative assessment

## 2015-09-04 NOTE — Discharge Instructions (Signed)
INSTRUCTIONS FOLLOWING OCULOPLASTIC SURGERY °AMY M. FOWLER, MD ° °AFTER YOUR EYE SURGERY, THER ARE MANY THINGS THWIHC YOU, THE PATIENT, CAN DO TO ASSURE THE BEST POSSIBLE RESULT FROM YOUR OPERATION.  THIS SHEET SHOULD BE REFERRED TO WHENEVER QUESTIONS ARISE.  IF THERE ARE ANY QUESTIONS NOT ANSWERED HERE, DO NOT HESITATE TO CALL OUR OFFICE AT [phone] OR [phone].  THERE IS ALWAYS [facility] AVAILABLE TO CALL IF QUESTIONS OR PROBLEMS ARISE. ° °VISION: Your vision may be blurred and out of focus after surgery until you are able to stop using your ointment, swelling resolves and your eye(s) heal. This may take 1 to 2 weeks at the least.  If your vision becomes gradually more dim or dark, this is not normal and you need to call our office immediately. ° °EYE CARE: For the first 48 hours after surgery, use ice packs frequently - “20 minutes on, 20 minutes off” - to help reduce swelling and bruising.  Small bags of frozen peas or corn make good ice packs along with cloths soaked in ice water.  If you are wearing a patch or other type of dressing following surgery, keep this on for the amount of time specified by your doctor.  For the first week following surgery, you will need to treat your stitches with great care.  If is OK to shower, but take care to not allow soapy water to run into your eye(s) to help reduce changes of infection.  You may gently clean the eyelashes and around the eye(s) with cotton balls and sterile water, BUT DO NOT RUB THE STITCHES VIGOROUSLY.  Keeping your stitches moist with ointment will help promote healing with minimal scar formation. ° °ACTIVITY: When you leave the surgery center, you should go home, rest and be inactive.  The eye(s) may feel scratchy and keeping the eyes closed will allow for faster healing.  The first week following surgery, avoid straining (anything making the face turn red) or lifting over 20 pounds.  Additionally, avoid bending which causes your head to go below  your waist.  Using your eyes will NOT harm them, so feel free to read, watch television, use the computer, etc as desired.  Driving depends on each individual, so check with your doctor if you have questions about driving. ° °MEDICATIONS:  You will be given a prescription for an ointment to use 4 times a day on your stitches.  You can use the ointment in your eyes if they feel scratchy or irritated.  If you eyelid(s) don’t close completely when you sleep, put some ointment in your eyes before bedtime. ° °EMERGENCY: If you experience SEVERE EYE PAIN OR HEADACHE UNRELIEVED BY TYLENOL OR PERCOCET, NAUSEA OR VOMITING, WORSENING REDNESS, OR WORSENING VISION (ESPECIALLY VISION THAT WA INITIALLY BETTER) CALL [phone] OR [phone] DURING BUSINESS HOURS OR AFTER HOURS. ° °General Anesthesia, Adult, Care After °Refer to this sheet in the next few weeks. These instructions provide you with information on caring for yourself after your procedure. Your health care provider may also give you more specific instructions. Your treatment has been planned according to current medical practices, but problems sometimes occur. Call your health care provider if you have any problems or questions after your procedure. °WHAT TO EXPECT AFTER THE PROCEDURE °After the procedure, it is typical to experience: °· Sleepiness. °· Nausea and vomiting. °HOME CARE INSTRUCTIONS °· For the first 24 hours after general anesthesia: °¨ Have a responsible person with you. °¨ Do not drive a car. If you   are alone, do not take public transportation. °¨ Do not drink alcohol. °¨ Do not take medicine that has not been prescribed by your health care provider. °¨ Do not sign important papers or make important decisions. °¨ You may resume a normal diet and activities as directed by your health care provider. °· Change bandages (dressings) as directed. °· If you have questions or problems that seem related to general anesthesia, call the hospital and ask for  the anesthetist or anesthesiologist on call. °SEEK MEDICAL CARE IF: °· You have nausea and vomiting that continue the day after anesthesia. °· You develop a rash. °SEEK IMMEDIATE MEDICAL CARE IF:  °· You have difficulty breathing. °· You have chest pain. °· You have any allergic problems. °  °This information is not intended to replace advice given to you by your health care provider. Make sure you discuss any questions you have with your health care provider. °  °Document Released: 08/25/2000 Document Revised: 06/09/2014 Document Reviewed: 09/17/2011 °Elsevier Interactive Patient Education ©2016 [employer] ° °

## 2015-09-04 NOTE — Anesthesia Preprocedure Evaluation (Signed)
Anesthesia Evaluation  Patient identified by MRN, date of birth, ID band Patient awake    Reviewed: Allergy & Precautions, H&P , NPO status , Patient's Chart, lab work & pertinent test results  History of Anesthesia Complications Negative for: history of anesthetic complications  Airway Mallampati: II  TM Distance: >3 FB Neck ROM: full    Dental no notable dental hx.    Pulmonary Current Smoker,    Pulmonary exam normal        Cardiovascular negative cardio ROS Normal cardiovascular exam     Neuro/Psych Hard of hearing    GI/Hepatic Neg liver ROS, GERD  Medicated,  Endo/Other  negative endocrine ROS  Renal/GU negative Renal ROS     Musculoskeletal   Abdominal   Peds  Hematology negative hematology ROS (+)   Anesthesia Other Findings   Reproductive/Obstetrics                             Anesthesia Physical Anesthesia Plan  ASA: II  Anesthesia Plan: MAC   Post-op Pain Management:    Induction:   Airway Management Planned:   Additional Equipment:   Intra-op Plan:   Post-operative Plan:   Informed Consent: I have reviewed the patients History and Physical, chart, labs and discussed the procedure including the risks, benefits and alternatives for the proposed anesthesia with the patient or authorized representative who has indicated his/her understanding and acceptance.     Plan Discussed with: CRNA  Anesthesia Plan Comments:         Anesthesia Quick Evaluation

## 2015-09-04 NOTE — Transfer of Care (Signed)
Immediate Anesthesia Transfer of Care Note  Patient: Raymond Gibson  Procedure(s) Performed: Procedure(s) with comments: ptosis repair right eye with biopsy of conjunctiva (Right) - WITH LMA VERY HARD OF HEARING/SPEAK SLOWLY SPECIAL EQUIPMENT:   FRONTALIS SLING INSTRUMENTS SELFF ROD  Patient Location: PACU  Anesthesia Type: MAC  Level of Consciousness: awake, alert  and patient cooperative  Airway and Oxygen Therapy: Patient Spontanous Breathing and Patient connected to supplemental oxygen  Post-op Assessment: Post-op Vital signs reviewed, Patient's Cardiovascular Status Stable, Respiratory Function Stable, Patent Airway and No signs of Nausea or vomiting  Post-op Vital Signs: Reviewed and stable  Complications: No apparent anesthesia complications

## 2015-09-04 NOTE — H&P (Signed)
  See history and physical completed at [facility] on 08/23/2015 and scanned into the chart

## 2015-09-05 ENCOUNTER — Encounter: Payer: Self-pay | Admitting: Ophthalmology

## 2015-09-17 LAB — SURGICAL PATHOLOGY

## 2015-09-18 ENCOUNTER — Telehealth: Payer: Self-pay | Admitting: Family Medicine

## 2015-09-18 NOTE — Telephone Encounter (Signed)
Patient just had surgery on his eye and due to the way that he has to sleep he has developed a catch in his back. Patient is requesting that you please call in a muscle relaxer to [facility]. States that he is not even able to tie his shoes.

## 2015-09-20 ENCOUNTER — Ambulatory Visit (INDEPENDENT_AMBULATORY_CARE_PROVIDER_SITE_OTHER): Payer: Medicaid Other | Admitting: Family Medicine

## 2015-09-20 ENCOUNTER — Encounter: Payer: Self-pay | Admitting: Family Medicine

## 2015-09-20 VITALS — BP 128/84 | HR 102 | Temp 98.5°F | Resp 18 | Ht 76.0 in | Wt 234.0 lb

## 2015-09-20 DIAGNOSIS — M62830 Muscle spasm of back: Secondary | ICD-10-CM | POA: Insufficient documentation

## 2015-09-20 MED ORDER — CYCLOBENZAPRINE HCL 10 MG PO TABS: 10.0000 mg | ORAL_TABLET | Freq: Three times a day (TID) | ORAL | Status: DC | PRN

## 2015-09-20 NOTE — Progress Notes (Signed)
Name: Raymond Gibson   MRN: [id number]    DOB: Feb 11, 1961   Date:09/20/2015       Progress Note  Subjective  Chief Complaint  Chief Complaint  Patient presents with  . Back Pain    fell 2 weeks ago    Back Pain This is a new problem. The current episode started in the past 7 days. The pain is present in the lumbar spine and gluteal. The pain is at a severity of 7/10. The pain is moderate. The symptoms are aggravated by sitting and lying down. He has tried analgesics (Has tried Codeine prescribed by the eye doctor for relief.) for the symptoms.   Patient fell backwards after the chair that he was resting on slip backwards, patient hit his lower back and flank area. Reports pain in right and left lower back. Past Medical History  Diagnosis Date  . Allergy   . Meningitis spinal 2015    In Coma for 2 weeks, in Hospital for 2 months.  . Pneumonia     HX OF  . 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 Chronic cough     due to allergies  . Swelling     of legs and feet  . Arthritis     septic arthritis right knee  . Full dentures   . GERD (gastroesophageal reflux disease)   . Complication of anesthesia     During ear pt stated that when anesthesia went into small vein in hand,her arm felt like it was on fire.  [name]     vertigo, dizziness    Past Surgical History  Procedure Laterality Date  . Knee surgery  07/2013  . Foot surgery  1978  . Colonoscopy with propofol N/A 05/22/2015    Procedure: COLONOSCOPY WITH PROPOFOL;  Surgeon: Lucilla Lame, MD;  Location: [facility] ENDOSCOPY;  Service: Endoscopy;  Laterality: N/A;  . Inner ear surgery    . Elbow surgery      REMOVAL OF ELBOW SPUR  . Tonsillectomy    . Ptosis repair Right 09/04/2015    Procedure: ptosis repair right eye with biopsy of conjunctiva;  Surgeon: Karle Starch, MD;  Location: [place];  Service: Ophthalmology;  Laterality: Right;  WITH LMA VERY HARD OF HEARING/SPEAK  SLOWLY SPECIAL EQUIPMENT:   FRONTALIS SLING INSTRUMENTS SELFF ROD    History reviewed. No pertinent family history.  Social History   Social History  . Marital Status: Married    Spouse Name: N/A  . Number of Children: N/A  . Years of Education: N/A   Occupational History  . Not on file.   Social History Main Topics  . Smoking status: Current Every Day Smoker -- 1.00 packs/day for 6 years    Types: Cigarettes  . Smokeless tobacco: Never Used  . Alcohol Use: 3.0 oz/week    5 Cans of beer per week  . Drug Use: No  . Sexual Activity:    Partners: Female   Other Topics Concern  . Not on file   Social History Narrative     Current outpatient prescriptions:  .  acetaminophen (TYLENOL) 500 MG tablet, Take 1,000 mg by mouth 2 (two) times daily. am, Disp: , Rfl:  .  erythromycin (ROMYCIN) ophthalmic ointment, Use a small amount on your sutures 4 times a day for the next 2 weeks. Switch to Aquaphor ointment should allergy develop., Disp: 3.5 g, Rfl: 3 .  esomeprazole (  NEXIUM) 20 MG capsule, Take 20 mg by mouth as needed. , Disp: , Rfl:  .  guaifenesin (HUMIBID E) 400 MG TABS tablet, Take 400 mg by mouth daily. , Disp: , Rfl:  .  meclizine (ANTIVERT) 25 MG tablet, Take 25 mg by mouth 2 (two) times daily. Reported on 08/29/2015/am and pm if needed, Disp: , Rfl:  .  Multiple Vitamin (MULTIVITAMIN WITH MINERALS) TABS tablet, Take 1 tablet by mouth daily. Reported on 08/30/2015, Disp: , Rfl:  .  oxyCODONE-acetaminophen (PERCOCET) 5-325 MG tablet, Take 1 tablet by mouth every 4 (four) hours as needed for severe pain., Disp: 6 tablet, Rfl: 0 .  sildenafil (VIAGRA) 50 MG tablet, Take 1 tablet (50 mg total) by mouth daily as needed for erectile dysfunction. 1 tab PO 30 mins prior to sexual activity (Patient not taking: Reported on 09/04/2015), Disp: 10 tablet, Rfl: 0 .  vitamin B-12 (CYANOCOBALAMIN) 1000 MCG tablet, Take 1,000 mcg by mouth daily. Reported on 08/30/2015, Disp: , Rfl:  .  Vitamin  D, Ergocalciferol, (DRISDOL) 50000 UNITS CAPS capsule, Take 1 capsule (50,000 Units total) by mouth once a week., Disp: 12 capsule, Rfl: 0  Allergies  Allergen Reactions  . Ceftriaxone Hives  . Meropenem     During hospitalization 2/22- 08/22/2013 patient had persistent fevers while on meropenem. 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 Known hives with ceftriaxone.     Review of Systems  Musculoskeletal: Positive for back pain and joint pain.     Objective  Filed Vitals:   09/20/15 1553  BP: 128/84  Pulse: 102  Temp: 98.5 F (36.9 C)  TempSrc: Oral  Resp: 18  Height: 6\' 4"  (1.93 m)  Weight: 234 lb (106.142 kg)  SpO2: 97%    Physical Exam  Constitutional: He is oriented to person, place, and time and well-developed, well-nourished, and in no distress.  Musculoskeletal:       Lumbar back: He exhibits pain and spasm.       Back:  Neurological: He is alert and oriented to person, place, and time.  Nursing note and vitals reviewed.   Assessment & Plan  1. Spasm of back muscles We'll start on Flexeril for relief of muscle spasm associated with trauma to lower back. Follow-up if no clinical improvement. - cyclobenzaprine (FLEXERIL) 10 MG tablet; Take 1 tablet (10 mg total) by mouth 3 (three) times daily as needed for muscle spasms.  Dispense: 30 tablet; Refill: 0   Ward Boissonneault Asad A. [facility] 09/20/2015 3:58 PM

## 2015-09-25 NOTE — Telephone Encounter (Signed)
Routed to Dr. Shah for advice  

## 2015-09-25 NOTE — Telephone Encounter (Signed)
Called and left a voice message. Patient was prescribed cyclobenzaprine 10 mg 3 times daily as needed on 09/20/2015.

## 2015-10-10 ENCOUNTER — Ambulatory Visit: Payer: Medicaid Other | Admitting: Family Medicine

## 2015-10-15 ENCOUNTER — Telehealth: Payer: Self-pay | Admitting: Family Medicine

## 2015-10-15 NOTE — Telephone Encounter (Addendum)
If patient is not having any improvement after 1 month, including 10 days of therapy with muscle relaxant, he needs to follow up to obtain imaging studies.

## 2015-10-15 NOTE — Telephone Encounter (Signed)
ASKING IF YOU COULD REFILL HIS MUSCLE SPASMS FOR NOW THEY ARE GOING DOWN HIS LEG INTO HIS THIGH. ALSO WANTED TO TELL YOU THAT HIS WAIST IS NOT SWOLLEN NOW BUT HAS MOVED DOWN TO HIS LEG INTO HIS THIGH AREA. PHARM IS [facility] [place]

## 2015-10-16 ENCOUNTER — Ambulatory Visit (INDEPENDENT_AMBULATORY_CARE_PROVIDER_SITE_OTHER): Payer: Medicaid Other | Admitting: Family Medicine

## 2015-10-16 ENCOUNTER — Encounter: Payer: Self-pay | Admitting: Family Medicine

## 2015-10-16 VITALS — BP 122/84 | HR 122 | Temp 98.5°F | Resp 20 | Ht 76.0 in | Wt 229.1 lb

## 2015-10-16 DIAGNOSIS — M62830 Muscle spasm of back: Secondary | ICD-10-CM

## 2015-10-16 MED ORDER — METAXALONE 800 MG PO TABS: 800.0000 mg | ORAL_TABLET | Freq: Three times a day (TID) | ORAL | Status: DC

## 2015-10-16 NOTE — Progress Notes (Signed)
Name: Raymond Gibson   MRN: [id number]    DOB: 03/04/1961   Date:10/16/2015       Progress Note  Subjective  Chief Complaint  Chief Complaint  Patient presents with  . back spasms    follow up for muscle spasms     HPI  Left Buttock Muscle Spasm: Left gluteal muscle spasm, present for at least 3-4 weeks, initially in his lwoer back, now traveled to his buttocks. Was prescribed Cyclobenzaprine 10 mg three times daily which helped relieve the spasm but now it seems to be centered in his buttock area. In addition, he now has pain going down his left anterior leg.   Past Medical History  Diagnosis Date  . Allergy   . Meningitis spinal 2015    In Coma for 2 weeks, in Hospital for 2 months.  . Pneumonia     HX OF  . 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 Chronic cough     due to allergies  . Swelling     of legs and feet  . Arthritis     septic arthritis right knee  . Full dentures   . GERD (gastroesophageal reflux disease)   . Complication of anesthesia     During ear pt stated that when anesthesia went into small vein in hand,her arm felt like it was on fire.  [name]     vertigo, dizziness    Past Surgical History  Procedure Laterality Date  . Knee surgery  07/2013  . Foot surgery  1978  . Colonoscopy with propofol N/A 05/22/2015    Procedure: COLONOSCOPY WITH PROPOFOL;  Surgeon: Lucilla Lame, MD;  Location: [facility] ENDOSCOPY;  Service: Endoscopy;  Laterality: N/A;  . Inner ear surgery    . Elbow surgery      REMOVAL OF ELBOW SPUR  . Tonsillectomy    . Ptosis repair Right 09/04/2015    Procedure: ptosis repair right eye with biopsy of conjunctiva;  Surgeon: Karle Starch, MD;  Location: [place];  Service: Ophthalmology;  Laterality: Right;  WITH LMA VERY HARD OF HEARING/SPEAK SLOWLY SPECIAL EQUIPMENT:   FRONTALIS SLING INSTRUMENTS SELFF ROD    No family history on file.  Social History   Social History  . Marital Status:  Married    Spouse Name: N/A  . Number of Children: N/A  . Years of Education: N/A   Occupational History  . Not on file.   Social History Main Topics  . Smoking status: Current Every Day Smoker -- 1.00 packs/day for 6 years    Types: Cigarettes  . Smokeless tobacco: Never Used  . Alcohol Use: 3.0 oz/week    5 Cans of beer per week  . Drug Use: No  . Sexual Activity:    Partners: Female   Other Topics Concern  . Not on file   Social History Narrative     Current outpatient prescriptions:  .  acetaminophen (TYLENOL) 500 MG tablet, Take 1,000 mg by mouth 2 (two) times daily. am, Disp: , Rfl:  .  cyclobenzaprine (FLEXERIL) 10 MG tablet, Take 1 tablet (10 mg total) by mouth 3 (three) times daily as needed for muscle spasms., Disp: 30 tablet, Rfl: 0 .  erythromycin (ROMYCIN) ophthalmic ointment, Use a small amount on your sutures 4 times a day for the next 2 weeks. Switch to Aquaphor ointment should allergy develop., Disp: 3.5 g, Rfl: 3 .  esomeprazole (NEXIUM)  20 MG capsule, Take 20 mg by mouth as needed. , Disp: , Rfl:  .  guaifenesin (HUMIBID E) 400 MG TABS tablet, Take 400 mg by mouth daily. , Disp: , Rfl:  .  meclizine (ANTIVERT) 25 MG tablet, Take 25 mg by mouth 2 (two) times daily. Reported on 08/29/2015/am and pm if needed, Disp: , Rfl:  .  Multiple Vitamin (MULTIVITAMIN WITH MINERALS) TABS tablet, Take 1 tablet by mouth daily. Reported on 08/30/2015, Disp: , Rfl:  .  oxyCODONE-acetaminophen (PERCOCET) 5-325 MG tablet, Take 1 tablet by mouth every 4 (four) hours as needed for severe pain., Disp: 6 tablet, Rfl: 0 .  sildenafil (VIAGRA) 50 MG tablet, Take 1 tablet (50 mg total) by mouth daily as needed for erectile dysfunction. 1 tab PO 30 mins prior to sexual activity (Patient not taking: Reported on 09/04/2015), Disp: 10 tablet, Rfl: 0 .  vitamin B-12 (CYANOCOBALAMIN) 1000 MCG tablet, Take 1,000 mcg by mouth daily. Reported on 08/30/2015, Disp: , Rfl:  .  Vitamin D, Ergocalciferol,  (DRISDOL) 50000 UNITS CAPS capsule, Take 1 capsule (50,000 Units total) by mouth once a week., Disp: 12 capsule, Rfl: 0  Allergies  Allergen Reactions  . Ceftriaxone Hives  . Meropenem     During hospitalization 2/22- 08/22/2013 patient had persistent fevers while on meropenem. 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 Known hives with ceftriaxone.     Review of Systems  Musculoskeletal: Positive for myalgias and back pain.    Objective  Filed Vitals:   10/16/15 1617  BP: 122/84  Pulse: 122  Temp: 98.5 F (36.9 C)  Resp: 20  Height: 6\' 4"  (1.93 m)  Weight: 229 lb 2 oz (103.93 kg)  SpO2: 97%    Physical Exam  Musculoskeletal:       Lumbar back: He exhibits tenderness, pain and spasm.       Back:  Nursing note and vitals reviewed.    Assessment & Plan  1. Muscle spasm of back Muscle spasm of lower back and left buttock area, start on Skelaxin for 10 days - metaxalone (SKELAXIN) 800 MG tablet; Take 1 tablet (800 mg total) by mouth 3 (three) times daily.  Dispense: 30 tablet; Refill: 0  [name] 10/16/2015 4:58 PM

## 2015-10-17 ENCOUNTER — Telehealth: Payer: Self-pay | Admitting: Family Medicine

## 2015-10-17 DIAGNOSIS — M62830 Muscle spasm of back: Secondary | ICD-10-CM

## 2015-10-17 NOTE — Telephone Encounter (Signed)
Please have patient obtain a list of muscle relaxants covered by Medicaid and will advise after reviewing the drugs

## 2015-10-17 NOTE — Telephone Encounter (Signed)
Pt states the muscle relaxer that was prescribed to pt yesterday, medicaid will not cover.Please advise

## 2015-10-18 MED ORDER — CYCLOBENZAPRINE HCL 10 MG PO TABS: 10.0000 mg | ORAL_TABLET | Freq: Three times a day (TID) | ORAL | Status: DC | PRN

## 2015-10-18 NOTE — Telephone Encounter (Signed)
Pt informed

## 2015-10-18 NOTE — Telephone Encounter (Signed)
Prescription for cyclobenzaprine has been sent to his pharmacy. 

## 2015-10-18 NOTE — Addendum Note (Signed)
Addended byManuella Ghazi, Arlo Butt A A on: 10/18/2015 12:45 PM   Modules accepted: Orders

## 2015-10-18 NOTE — Telephone Encounter (Signed)
Pt states he would like what to get was given to him before. He cannot remember the name and his insurance will cover it.

## 2015-10-24 ENCOUNTER — Emergency Department
Admission: EM | Admit: 2015-10-24 | Discharge: 2015-10-24 | Disposition: A | Discharge status: Home / Self Care | Payer: Medicaid Other | Attending: Emergency Medicine | Admitting: Emergency Medicine

## 2015-10-24 ENCOUNTER — Emergency Department: Payer: Medicaid Other

## 2015-10-24 ENCOUNTER — Encounter: Payer: Self-pay | Admitting: Emergency Medicine

## 2015-10-24 DIAGNOSIS — Y929 Unspecified place or not applicable: Secondary | ICD-10-CM | POA: Diagnosis not present

## 2015-10-24 DIAGNOSIS — M1990 Unspecified osteoarthritis, unspecified site: Secondary | ICD-10-CM | POA: Insufficient documentation

## 2015-10-24 DIAGNOSIS — Z79899 Other long term (current) drug therapy: Secondary | ICD-10-CM | POA: Insufficient documentation

## 2015-10-24 DIAGNOSIS — W07XXXA Fall from chair, initial encounter: Secondary | ICD-10-CM | POA: Diagnosis not present

## 2015-10-24 DIAGNOSIS — Y999 Unspecified external cause status: Secondary | ICD-10-CM | POA: Diagnosis not present

## 2015-10-24 DIAGNOSIS — S79911A Unspecified injury of right hip, initial encounter: Secondary | ICD-10-CM | POA: Diagnosis present

## 2015-10-24 DIAGNOSIS — S7001XA Contusion of right hip, initial encounter: Secondary | ICD-10-CM | POA: Insufficient documentation

## 2015-10-24 DIAGNOSIS — S7011XA Contusion of right thigh, initial encounter: Secondary | ICD-10-CM | POA: Diagnosis not present

## 2015-10-24 DIAGNOSIS — Y939 Activity, unspecified: Secondary | ICD-10-CM | POA: Diagnosis not present

## 2015-10-24 DIAGNOSIS — F17210 Nicotine dependence, cigarettes, uncomplicated: Secondary | ICD-10-CM | POA: Insufficient documentation

## 2015-10-24 MED ORDER — MELOXICAM 15 MG PO TABS: 15.0000 mg | ORAL_TABLET | Freq: Every day | ORAL | Status: DC

## 2015-10-24 MED ORDER — HYDROMORPHONE HCL 1 MG/ML IJ SOLN
1.0000 mg | Freq: Once | INTRAMUSCULAR | Status: AC
  Administered 2015-10-24: 1 mg via INTRAVENOUS
  Filled 2015-10-24: qty 1

## 2015-10-24 MED ORDER — TIZANIDINE HCL 4 MG PO CAPS: 4.0000 mg | ORAL_CAPSULE | Freq: Four times a day (QID) | ORAL | Status: DC | PRN

## 2015-10-24 MED ORDER — HYDROCODONE-ACETAMINOPHEN 5-325 MG PO TABS: 1.0000 | ORAL_TABLET | ORAL | Status: DC | PRN

## 2015-10-24 MED ORDER — DIAZEPAM 5 MG/ML IJ SOLN
5.0000 mg | Freq: Once | INTRAMUSCULAR | Status: AC
  Administered 2015-10-24: 5 mg via INTRAVENOUS
  Filled 2015-10-24: qty 2

## 2015-10-24 NOTE — Discharge Instructions (Signed)
Iliac Crest Contusion An iliac crest contusion is a deep bruise of your hip bone (hip pointer). Contusions are the result of an injury that caused bleeding under the skin. The contusion may turn blue, purple, or yellow. Minor injuries will give you a painless contusion, but more severe contusions may stay painful and swollen for a few weeks.  CAUSES  An iliac crest contusion is usually caused by a blow to the top of your hip pointer. The injury most often comes from contact sports.  SYMPTOMS   Swelling and redness of the hip area.  Bruising of the hip area.  Tenderness or soreness of the hip. DIAGNOSIS  The diagnosis can be made by taking your history and doing a physical exam. You may need an X-ray of your hip pointer to look for a broken bone (fracture). TREATMENT  Often, the best treatment for an iliac crest contusion is resting, icing, elevating, and applying cold compresses to the injured area. Over-the-counter medicines may also be recommended for pain control. Crutches may be used if walking is very painful. Some people need physical therapy to help with range of motion and strengthening.  HOME CARE INSTRUCTIONS   Put ice on the injured area.  Put ice in a plastic bag.  Place a towel between your skin and the bag.  Leave the ice on for 15-20 minutes, 03-04 times a day.  Only take over-the-counter or prescription medicines for pain, discomfort, or fever as directed by your caregiver. Your caregiver may recommend avoiding anti-inflammatory medicines (aspirin, ibuprofen, and naproxen) for 48 hours because these medicines may increase bruising.  Keep your leg straightened (extended) when possible.  Walk or move around as the pain allows, or as directed by your caregiver. Use crutches if your caregiver recommends them.  Apply compression wraps as directed by your caregiver. You may remove the wraps for sleeping, showers, and baths. SEEK IMMEDIATE MEDICAL CARE IF:   You have  increased bruising or swelling.  You have pain that is getting worse.  Your swelling or pain is not relieved with medicines.  Your toes get cold. MAKE SURE YOU:   Understand these instructions.  Will watch your condition.  Will get help right away if you are not doing well or get worse.   This information is not intended to replace advice given to you by your health care provider. Make sure you discuss any questions you have with your health care provider.   Document Released: 02/11/2001 Document Revised: 08/11/2011 Document Reviewed: 10/04/2014 Elsevier Interactive Patient Education 2016 Elsevier Inc[employer]  Quadriceps Contusion A quadriceps contusion is a deep bruise of the large muscle in the front of your thigh. Contusions are the result of an injury that caused bleeding under the skin. The contusion may turn blue, purple, or yellow. Minor injuries will give you a painless contusion, but more severe contusions may stay painful and swollen for a few weeks. It is necessary to follow your caregiver's directions when this muscle is bruised.  CAUSES A quadriceps contusion comes from a blow or injury to the front of the leg. SYMPTOMS   Swelling and redness of the thigh area.  Bruising of the thigh area.  Tenderness or soreness of the thigh.  Limping.  Leg stiffness.  Difficulty bending the leg.  Trouble walking. DIAGNOSIS  You will have a physical exam and will be asked about your history. You may need an X-ray of your leg. TREATMENT  Often, the best treatment for a quadriceps contusion is  resting and elevating the leg and applying cold compresses to the thigh area. Over-the-counter medicines may also be recommended for pain control. You may need crutches, an elastic wrap, or a leg splint.  HOME CARE INSTRUCTIONS   Put ice on the injured area.  Put ice in a plastic bag.  Place a towel between your skin and the bag.  Leave the ice on for 15-20 minutes, 03-04 times a  day.  Only take over-the-counter or prescription medicines for pain, discomfort, or fever as directed by your caregiver.  Rest the injured thigh until the pain and swelling are better.  Elevate your leg to reduce swelling. Lie down flat on your back and place a pillow under your knee.  Apply compression wraps as directed by your caregiver. You may remove it for sleeping, showers, and baths. If your toes become numb, cold, or blue, take the wrap off and reapply it more loosely.  Walk or move around as the pain allows, or as directed by your caregiver. Resume full activities only when your caregiver says it is okay. Returning to your usual activities before your caregiver approves may cause worse damage to the muscle.  See your caregiver as directed. It is very important to keep all follow-up referrals and appointments in order to avoid any long-term problems with your leg, including chronic pain or inability to move your leg normally. SEEK MEDICAL CARE IF:   You have increased bruising or swelling.  You have pain that is getting worse.  Your swelling or pain is not relieved by medicines.  Your toes or foot become cold or turn bluish in color.  You notice your thigh getting larger in size. MAKE SURE YOU:   Understand these instructions.  Will watch your condition.  Will get help right away if you are not doing well or get worse.   This information is not intended to replace advice given to you by your health care provider. Make sure you discuss any questions you have with your health care provider.   Document Released: 02/11/2001 Document Revised: 06/09/2014 Document Reviewed: 10/04/2014 Elsevier Interactive Patient Education [employer].

## 2015-10-24 NOTE — ED Provider Notes (Signed)
[facility] Emergency Department Provider Note  ____________________________________________  Time seen: Approximately 12:14 PM  I have reviewed the triage vital signs and the nursing notes.   HISTORY  Chief Complaint Back Pain    HPI Raymond Gibson is a 55 y.o. male presents for evaluation of severe lower back pelvic and hip pain. Patient reports that he fell out of a recliner about 3 weeks ago saw his PCP was given Flexeril for muscle spasms states that partially get better but the pain is returned with more intensity. Describes pain as 10 out of 10 radiating down his left leg.   Past Medical History  Diagnosis Date  . Allergy   . Meningitis spinal 2015    In Coma for 2 weeks, in Hospital for 2 months.  . Pneumonia     HX OF  . 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 Chronic cough     due to allergies  . Swelling     of legs and feet  . Arthritis     septic arthritis right knee  . Full dentures   . GERD (gastroesophageal reflux disease)   . Complication of anesthesia     During ear pt stated that when anesthesia went into small vein in hand,her arm felt like it was on fire.  [name]     vertigo, dizziness    Patient Active Problem List   Diagnosis Date Noted  . Muscle spasm of back 10/16/2015  . Spasm of back muscles 09/20/2015  . Vitamin D insufficiency 08/30/2015  . ED (erectile dysfunction) 08/30/2015  . Special screening for malignant neoplasms, colon   . Benign neoplasm of transverse colon   . Benign neoplasm of sigmoid colon   . Annual physical exam 04/25/2015  . Hearing difficulty of both ears 03/23/2015  . Acid reflux 01/18/2014  . Snores 01/18/2014  . Bilateral sensorineural hearing loss 08/30/2013  . Buzzing in ear 08/30/2013    Past Surgical History  Procedure Laterality Date  . Knee surgery  07/2013  . Foot surgery  1978  . Colonoscopy with propofol N/A 05/22/2015    Procedure:  COLONOSCOPY WITH PROPOFOL;  Surgeon: Lucilla Lame, MD;  Location: [facility] ENDOSCOPY;  Service: Endoscopy;  Laterality: N/A;  . Inner ear surgery    . Elbow surgery      REMOVAL OF ELBOW SPUR  . Tonsillectomy    . Ptosis repair Right 09/04/2015    Procedure: ptosis repair right eye with biopsy of conjunctiva;  Surgeon: Karle Starch, MD;  Location: [place];  Service: Ophthalmology;  Laterality: Right;  WITH LMA VERY HARD OF HEARING/SPEAK SLOWLY SPECIAL EQUIPMENT:   FRONTALIS SLING INSTRUMENTS SELFF ROD    Current Outpatient Rx  Name  Route  Sig  Dispense  Refill  . acetaminophen (TYLENOL) 500 MG tablet   Oral   Take 1,000 mg by mouth 2 (two) times daily. am         . erythromycin [facility]) ophthalmic ointment      Use a small amount on your sutures 4 times a day for the next 2 weeks. Switch to Aquaphor ointment should allergy develop.   3.5 g   3   . esomeprazole (NEXIUM) 20 MG capsule   Oral   Take 20 mg by mouth as needed.          Marland Kitchen guaifenesin (HUMIBID E) 400 MG TABS tablet   Oral  Take 400 mg by mouth daily.          Marland Kitchen HYDROcodone-acetaminophen (NORCO) 5-325 MG tablet   Oral   Take 1-2 tablets by mouth every 4 (four) hours as needed for moderate pain.   15 tablet   0   . meclizine (ANTIVERT) 25 MG tablet   Oral   Take 25 mg by mouth 2 (two) times daily. Reported on 08/29/2015/am and pm if needed         . meloxicam (MOBIC) 15 MG tablet   Oral   Take 1 tablet (15 mg total) by mouth daily.   30 tablet   0   . metaxalone (SKELAXIN) 800 MG tablet   Oral   Take 1 tablet (800 mg total) by mouth 3 (three) times daily.   30 tablet   0   . Multiple Vitamin (MULTIVITAMIN WITH MINERALS) TABS tablet   Oral   Take 1 tablet by mouth daily. Reported on 08/30/2015         . sildenafil (VIAGRA) 50 MG tablet   Oral   Take 1 tablet (50 mg total) by mouth daily as needed for erectile dysfunction. 1 tab PO 30 mins prior to sexual activity Patient not taking:  Reported on 09/04/2015   10 tablet   0   . tiZANidine (ZANAFLEX) 4 MG capsule   Oral   Take 1 capsule (4 mg total) by mouth 4 (four) times daily as needed for muscle spasms.   40 capsule   0   . vitamin B-12 (CYANOCOBALAMIN) 1000 MCG tablet   Oral   Take 1,000 mcg by mouth daily. Reported on 08/30/2015         . Vitamin D, Ergocalciferol, (DRISDOL) 50000 UNITS CAPS capsule   Oral   Take 1 capsule (50,000 Units total) by mouth once a week.   12 capsule   0     Allergies Ceftriaxone and Meropenem  No family history on file.  Social History Social History  Substance Use Topics  . Smoking status: Current Every Day Smoker -- 1.00 packs/day for 6 years    Types: Cigarettes  . Smokeless tobacco: Never Used  . Alcohol Use: 3.0 oz/week    5 Cans of beer per week    Review of Systems Constitutional: No fever/chills Eyes: No visual changes. ENT: No sore throat. Cardiovascular: Denies chest pain. Respiratory: Denies shortness of breath. Gastrointestinal: No abdominal pain.  No nausea, no vomiting.  No diarrhea.  No constipation. Genitourinary: Negative for dysuria. Musculoskeletal: Positive for low back pain. Skin: Negative for rash. Neurological: Negative for headaches, focal weakness or numbness.  10-point ROS otherwise negative.  ____________________________________________   PHYSICAL EXAM:  VITAL SIGNS: ED Triage Vitals  Enc Vitals Group     BP 10/24/15 1143 154/93 mmHg     Pulse Rate 10/24/15 1143 116     Resp 10/24/15 1142 20     Temp --      Temp Source 10/24/15 1142 Oral     SpO2 10/24/15 1142 100 %     Weight 10/24/15 1142 235 lb (106.595 kg)     Height 10/24/15 1142 6\' 4"  (1.93 m)     Head Cir --      Peak Flow --      Pain Score 10/24/15 1143 10     Pain Loc --      Pain Edu? --      Excl. in [place]? --     Constitutional: Alert and oriented.  Well appearing and in no acute distress. Eyes: Conjunctivae are normal. PERRL. EOMI. Head:  Atraumatic. Nose: No congestion/rhinnorhea. Mouth/Throat: Mucous membranes are moist.  Oropharynx non-erythematous. Neck: No stridor.   Cardiovascular: Normal rate, regular rhythm. Grossly normal heart sounds.  Good peripheral circulation. Respiratory: Normal respiratory effort.  No retractions. Lungs CTAB. Gastrointestinal: Soft and nontender. No distention. No abdominal bruits. No CVA tenderness. Musculoskeletal: Extremity tenderness over the buttocks. Straight leg raise positive at about 40. Neurologic:  Normal speech and language. No gross focal neurologic deficits are appreciated. No gait instability. Skin:  Skin is warm, dry and intact. No rash noted. Psychiatric: Mood and affect are normal. Speech and behavior are normal.  ____________________________________________   LABS (all labs ordered are listed, but only abnormal results are displayed)  Labs Reviewed - No data to display   RADIOLOGY  IMPRESSION: No acute fracture or subluxation. No radiopaque foreign body. There is deformity of right pelvis in acetabular region. This may be developmental in nature or due to prior injury. Clinical correlation is necessary. Mild degenerative changes pubic symphysis. ____________________________________________   PROCEDURES  Procedure(s) performed: None  Critical Care performed: No  ____________________________________________   INITIAL IMPRESSION / ASSESSMENT AND PLAN / ED COURSE  Pertinent labs & imaging results that were available during my care of the patient were reviewed by me and considered in my medical decision making (see chart for details).  Status post fall with left hip and thigh contusion. Reassurance provided to the patient Rx given for Zanaflex 4 mg and Mobic 15 mg. Patient to follow-up with his PCP or return to ER with any worsening symptomology. ____________________________________________   FINAL CLINICAL IMPRESSION(S) / ED DIAGNOSES  Final diagnoses:   Contusion, hip and thigh, right, initial encounter     This chart was dictated using voice recognition software/Dragon. Despite best efforts to proofread, errors can occur which can change the meaning. Any change was purely unintentional.   Arlyss Repress, PA-C 10/24/15 [place], MD 10/24/15 [phone]

## 2015-10-24 NOTE — ED Notes (Signed)
Pt presents with right side hip pain after falling out of a recliner three weeks go.

## 2015-10-24 NOTE — ED Notes (Signed)
Discussed discharge instructions, prescriptions, and follow-up care with patient. No questions or concerns at this time. Pt stable at discharge.  

## 2015-11-06 ENCOUNTER — Encounter: Payer: Self-pay | Admitting: Emergency Medicine

## 2015-11-06 ENCOUNTER — Emergency Department
Admission: EM | Admit: 2015-11-06 | Discharge: 2015-11-06 | Disposition: A | Discharge status: Home / Self Care | Payer: Medicaid Other | Attending: Emergency Medicine | Admitting: Emergency Medicine

## 2015-11-06 DIAGNOSIS — M1990 Unspecified osteoarthritis, unspecified site: Secondary | ICD-10-CM | POA: Insufficient documentation

## 2015-11-06 DIAGNOSIS — N289 Disorder of kidney and ureter, unspecified: Secondary | ICD-10-CM | POA: Insufficient documentation

## 2015-11-06 DIAGNOSIS — M79652 Pain in left thigh: Secondary | ICD-10-CM | POA: Insufficient documentation

## 2015-11-06 DIAGNOSIS — M545 Low back pain, unspecified: Secondary | ICD-10-CM

## 2015-11-06 DIAGNOSIS — Z79899 Other long term (current) drug therapy: Secondary | ICD-10-CM | POA: Insufficient documentation

## 2015-11-06 DIAGNOSIS — F17210 Nicotine dependence, cigarettes, uncomplicated: Secondary | ICD-10-CM | POA: Diagnosis not present

## 2015-11-06 DIAGNOSIS — M62838 Other muscle spasm: Secondary | ICD-10-CM | POA: Diagnosis present

## 2015-11-06 LAB — BASIC METABOLIC PANEL
Anion gap: 9 (ref 5–15)
BUN: 18 mg/dL (ref 6–20)
CO2: 25 mmol/L (ref 22–32)
CREATININE: 1.33 mg/dL — AB (ref 0.61–1.24)
Calcium: 10.2 mg/dL (ref 8.9–10.3)
Chloride: 106 mmol/L (ref 101–111)
GFR calc Af Amer: >60 mL/min (ref >60)
GFR, EST NON AFRICAN AMERICAN: 58 mL/min — AB (ref >60)
GLUCOSE: 123 mg/dL — AB (ref 65–99)
Potassium: 4.1 mmol/L (ref 3.5–5.1)
SODIUM: 140 mmol/L (ref 135–145)

## 2015-11-06 LAB — CBC
HCT: 49.1 % (ref 40.0–52.0)
Hemoglobin: 16.7 g/dL (ref 13.0–18.0)
MCH: 31.7 pg (ref 26.0–34.0)
MCHC: 34 g/dL (ref 32.0–36.0)
MCV: 93.2 fL (ref 80.0–100.0)
PLATELETS: 264 10*3/uL (ref 150–440)
RBC: 5.26 MIL/uL (ref 4.40–5.90)
RDW: 13.3 % (ref 11.5–14.5)
WBC: 8.2 10*3/uL (ref 3.8–10.6)

## 2015-11-06 MED ORDER — DIAZEPAM 5 MG PO TABS
ORAL_TABLET | ORAL | Status: AC
  Administered 2015-11-06: 5 mg via ORAL
  Filled 2015-11-06: qty 1

## 2015-11-06 MED ORDER — DIAZEPAM 5 MG PO TABS
5.0000 mg | ORAL_TABLET | Freq: Once | ORAL | Status: AC
  Administered 2015-11-06: 5 mg via ORAL

## 2015-11-06 NOTE — Discharge Instructions (Signed)
Please drink plenty of fluid to stay well hydrated, and to improve your kidney function. Do not take any NSAID medications such as Aleve, ibuprofen, Motrin, or Advil, as this can worsen your kidney function. Please make an appointment with your primary care physician to have her kidney function rechecked.  For your leg and back pain, you may apply ice, or heat, for 10 minutes every 3 hours. You may also take Tylenol for pain. Please make sure that you are active throughout the day, and that you do not over strain yourself or stay still, as this can worsen your pain.  Return to the emergency department if you develop severe pain, numbness tingling or weakness, changes in bladder or bowel function, difficult walking, or any other symptoms concerning to you.

## 2015-11-06 NOTE — ED Notes (Signed)
Patient presents to the ED with muscle spasms for over 1 month.  Patient states he was in the ED last week and was prescribed Percocet and tizanidine which helped but now patient is out of these medications and pain and spasms are back.  Patient states spasms began after patient had an eye surgery and then fell.  Patient states spasms are worst in his gluteus and in his left leg.

## 2015-11-06 NOTE — ED Provider Notes (Addendum)
[facility] Emergency Department Provider Note  ____________________________________________  Time seen: Approximately 4:17 PM  I have reviewed the triage vital signs and the nursing notes.   HISTORY  Chief Complaint Spasms    HPI Raymond Gibson is a 55 y.o. male presenting with left low back, left lateral thigh pain and "knots." The patient reports that for 1 month in 1 week, he has been having pain as above. He states that he is fine when he gets out of bed but when he walks back to his bed from the bathroom, he feels "knots" in his left low back and thigh. He has tried heat and ice without improvement. He has tried codeine but says that it doesn't work. No fevers, chills, nausea vomiting or diarrhea. No numbness tingling or weakness, difficulty walking, saddle anesthesia, urinary or fecal incontinence or retention.   Past Medical History  Diagnosis Date  . Allergy   . Meningitis spinal 2015    In Coma for 2 weeks, in Hospital for 2 months.  . Pneumonia     HX OF  . 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 Chronic cough     due to allergies  . Swelling     of legs and feet  . Arthritis     septic arthritis right knee  . Full dentures   . GERD (gastroesophageal reflux disease)   . Complication of anesthesia     During ear pt stated that when anesthesia went into small vein in hand,her arm felt like it was on fire.  [name]     vertigo, dizziness    Patient Active Problem List   Diagnosis Date Noted  . Muscle spasm of back 10/16/2015  . Spasm of back muscles 09/20/2015  . Vitamin D insufficiency 08/30/2015  . ED (erectile dysfunction) 08/30/2015  . Special screening for malignant neoplasms, colon   . Benign neoplasm of transverse colon   . Benign neoplasm of sigmoid colon   . Annual physical exam 04/25/2015  . Hearing difficulty of both ears 03/23/2015  . Acid reflux 01/18/2014  . Snores 01/18/2014  .  Bilateral sensorineural hearing loss 08/30/2013  . Buzzing in ear 08/30/2013    Past Surgical History  Procedure Laterality Date  . Knee surgery  07/2013  . Foot surgery  1978  . Colonoscopy with propofol N/A 05/22/2015    Procedure: COLONOSCOPY WITH PROPOFOL;  Surgeon: Lucilla Lame, MD;  Location: [facility] ENDOSCOPY;  Service: Endoscopy;  Laterality: N/A;  . Inner ear surgery    . Elbow surgery      REMOVAL OF ELBOW SPUR  . Tonsillectomy    . Ptosis repair Right 09/04/2015    Procedure: ptosis repair right eye with biopsy of conjunctiva;  Surgeon: Karle Starch, MD;  Location: [place];  Service: Ophthalmology;  Laterality: Right;  WITH LMA VERY HARD OF HEARING/SPEAK SLOWLY SPECIAL EQUIPMENT:   FRONTALIS SLING INSTRUMENTS SELFF ROD    Current Outpatient Rx  Name  Route  Sig  Dispense  Refill  . acetaminophen (TYLENOL) 500 MG tablet   Oral   Take 1,000 mg by mouth 2 (two) times daily. am         . erythromycin [facility]) ophthalmic ointment      Use a small amount on your sutures 4 times a day for the next 2 weeks. Switch to Aquaphor ointment should allergy develop.   3.5 g  3   . esomeprazole (NEXIUM) 20 MG capsule   Oral   Take 20 mg by mouth as needed.          Marland Kitchen guaifenesin (HUMIBID E) 400 MG TABS tablet   Oral   Take 400 mg by mouth daily.          Marland Kitchen HYDROcodone-acetaminophen (NORCO) 5-325 MG tablet   Oral   Take 1-2 tablets by mouth every 4 (four) hours as needed for moderate pain.   15 tablet   0   . meclizine (ANTIVERT) 25 MG tablet   Oral   Take 25 mg by mouth 2 (two) times daily. Reported on 08/29/2015/am and pm if needed         . meloxicam (MOBIC) 15 MG tablet   Oral   Take 1 tablet (15 mg total) by mouth daily.   30 tablet   0   . metaxalone (SKELAXIN) 800 MG tablet   Oral   Take 1 tablet (800 mg total) by mouth 3 (three) times daily.   30 tablet   0   . Multiple Vitamin (MULTIVITAMIN WITH MINERALS) TABS tablet   Oral   Take 1  tablet by mouth daily. Reported on 08/30/2015         . sildenafil (VIAGRA) 50 MG tablet   Oral   Take 1 tablet (50 mg total) by mouth daily as needed for erectile dysfunction. 1 tab PO 30 mins prior to sexual activity Patient not taking: Reported on 09/04/2015   10 tablet   0   . tiZANidine (ZANAFLEX) 4 MG capsule   Oral   Take 1 capsule (4 mg total) by mouth 4 (four) times daily as needed for muscle spasms.   40 capsule   0   . vitamin B-12 (CYANOCOBALAMIN) 1000 MCG tablet   Oral   Take 1,000 mcg by mouth daily. Reported on 08/30/2015         . Vitamin D, Ergocalciferol, (DRISDOL) 50000 UNITS CAPS capsule   Oral   Take 1 capsule (50,000 Units total) by mouth once a week.   12 capsule   0     Allergies Ceftriaxone and Meropenem  No family history on file.  Social History Social History  Substance Use Topics  . Smoking status: Current Every Day Smoker -- 1.00 packs/day for 6 years    Types: Cigarettes  . Smokeless tobacco: Never Used  . Alcohol Use: 3.0 oz/week    5 Cans of beer per week    Review of Systems Constitutional: No fever/chills.No lightheadedness or syncope. Eyes: No visual changes. ENT: No sore throat. No congestion or rhinorrhea. Cardiovascular: Denies chest pain. Denies palpitations. Respiratory: Denies shortness of breath.  No cough. Gastrointestinal: No abdominal pain.  No nausea, no vomiting.  No diarrhea.  No constipation. Genitourinary: Negative for dysuria. Musculoskeletal: Positive for left lower back pain, left thigh pain, and "knot." Skin: Negative for rash. Neurological: Negative for headaches. No focal numbness, tingling or weakness.   10-point ROS otherwise negative.  ____________________________________________   PHYSICAL EXAM:  VITAL SIGNS: ED Triage Vitals  Enc Vitals Group     BP 11/06/15 1404 178/91 mmHg     Pulse Rate 11/06/15 1404 126     Resp 11/06/15 1404 24     Temp 11/06/15 1404 97.7 F (36.5 C)     Temp Source  11/06/15 1404 Oral     SpO2 11/06/15 1404 99 %     Weight 11/06/15 1404 215 lb (97.523  kg)     Height 11/06/15 1404 6' 4.5" (1.943 m)     Head Cir --      Peak Flow --      Pain Score 11/06/15 1405 10     Pain Loc --      Pain Edu? --      Excl. in [place]? --     Constitutional: Alert and oriented. Well appearing and in no acute distress. Bizarre affect but able stable to answer questions appropriately and has appropriate insight. Eyes: Conjunctivae are normal.  EOMI. No scleral icterus. Head: Atraumatic. Nose: No congestion/rhinnorhea. Mouth/Throat: Mucous membranes are moist.  Neck: No stridor.  Supple.  No meningismus. Full range of motion without pain. Cardiovascular: Normal rate,.  Respiratory: Normal respiratory effort.   Musculoskeletal: No LE edema. No ttp in the calves or palpable cords.  Negative Homan's sign. No palpable masses in the left lower back, or left thigh. No significant tenderness on my palpation of the left lower back or the left thigh. Skin is intact and there is no overlying rash. No midline C, T, or L-spine tenderness to palpation. Neurologic:  A&Ox3.  Speech is clear.  Face and smile are symmetric.  EOMI.  Moves all extremities well. Normal gait without ataxia. 5 out of 5 quadriceps, hamstrings, plantar flexion and dorsiflexion bilaterally. Normal sensation to light touch in the bilateral lower extremities. Skin:  Skin is warm, dry and intact. No rash noted. Psychiatric: Bizarre affect.  ____________________________________________   LABS (all labs ordered are listed, but only abnormal results are displayed)  Labs Reviewed  BASIC METABOLIC PANEL - Abnormal; Notable for the following:    Glucose, Bld 123 (*)    Creatinine, Ser 1.33 (*)    GFR calc non Af Amer 58 (*)    All other components within normal limits  CBC  URINALYSIS COMPLETEWITH MICROSCOPIC ([facility] ONLY)  URINE DRUG SCREEN, QUALITATIVE ([facility] ONLY)    ____________________________________________  EKG  ED ECG REPORT I, Eula Listen, the attending physician, personally viewed and interpreted this ECG.   Date: 11/06/2015  EKG Time: 1411  Rate: 124  Rhythm: sinus tachycardia  Axis: Normal  Intervals:none  ST&T Change: No ST changes.  Patient heart rate has normalized on my examination.  ____________________________________________  RADIOLOGY  No results found.  ____________________________________________   PROCEDURES  Procedure(s) performed: None  Critical Care performed: No ____________________________________________   INITIAL IMPRESSION / ASSESSMENT AND PLAN / ED COURSE  Pertinent labs & imaging results that were available during my care of the patient were reviewed by me and considered in my medical decision making (see chart for details).  55 y.o. male presenting with left lower back and left lateral thigh pain and "knots". I'm unable to reproduce the pain on palpation, and he does not have any palpable knots. There is no evidence of abscess. The patient does not have any history of physical exam findings that would be concerning for sciatica, cauda equina syndrome, or spinal cord compression. At this time, I'll plan to treat him for musculoskeletal pain with Tylenol, as he does have some mild renal insufficiency today. He is aware that he has renal insufficiency, and that he needs to drink plenty of fluids and stay away from NSAID medications. He will have this rechecked by his primary care physician within the next week.  ____________________________________________  FINAL CLINICAL IMPRESSION(S) / ED DIAGNOSES  Final diagnoses:  Left thigh pain  Left-sided low back pain without sciatica  Acute renal insufficiency  NEW MEDICATIONS STARTED DURING THIS VISIT:  New Prescriptions   No medications on file     Eula Listen, MD 11/06/15 1627  Eula Listen, MD 11/06/15  [id number]

## 2015-11-07 ENCOUNTER — Ambulatory Visit (INDEPENDENT_AMBULATORY_CARE_PROVIDER_SITE_OTHER): Payer: Medicaid Other | Admitting: Family Medicine

## 2015-11-07 ENCOUNTER — Encounter: Payer: Self-pay | Admitting: Family Medicine

## 2015-11-07 VITALS — BP 137/85 | HR 129 | Temp 98.4°F | Resp 19 | Ht 76.0 in | Wt 216.6 lb

## 2015-11-07 DIAGNOSIS — S76012A Strain of muscle, fascia and tendon of left hip, initial encounter: Secondary | ICD-10-CM | POA: Insufficient documentation

## 2015-11-07 DIAGNOSIS — S76012D Strain of muscle, fascia and tendon of left hip, subsequent encounter: Secondary | ICD-10-CM

## 2015-11-07 DIAGNOSIS — S76312D Strain of muscle, fascia and tendon of the posterior muscle group at thigh level, left thigh, subsequent encounter: Secondary | ICD-10-CM

## 2015-11-07 MED ORDER — CARISOPRODOL 350 MG PO TABS: 350.0000 mg | ORAL_TABLET | Freq: Three times a day (TID) | ORAL | Status: DC

## 2015-11-07 MED ORDER — NAPROXEN 500 MG PO TABS: 500.0000 mg | ORAL_TABLET | Freq: Two times a day (BID) | ORAL | Status: DC

## 2015-11-07 NOTE — Progress Notes (Signed)
Name: Raymond Gibson   MRN: [id number]    DOB: Nov 03, 1960   Date:11/07/2015       Progress Note  Subjective  Chief Complaint  Chief Complaint  Patient presents with  . Referral    MRI    HPI  Left Hip and Leg Pain: Pt. presents for evaluation of continued left gluteal pain, tightness,radiating down into the left leg. He feels like pain in gluteal area is causing his buttock area to swell up. He was recently seen at the [facility] ED and an X ray of left hip was unremarkable. In the past, he has tried Meloxicam, Tizanidine, Cyclobenzaprine, and Hydrocodone-Acetaminophen, all of which provided temporary relief. He also uses ice pack on his left leg for relief. Pain is worse with walking,    Past Medical History  Diagnosis Date  . Allergy   . Meningitis spinal 2015    In Coma for 2 weeks, in Hospital for 2 months.  . Pneumonia     HX OF  . 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 Chronic cough     due to allergies  . Swelling     of legs and feet  . Arthritis     septic arthritis right knee  . Full dentures   . GERD (gastroesophageal reflux disease)   . Complication of anesthesia     During ear pt stated that when anesthesia went into small vein in hand,her arm felt like it was on fire.  [name]     vertigo, dizziness    Past Surgical History  Procedure Laterality Date  . Knee surgery  07/2013  . Foot surgery  1978  . Colonoscopy with propofol N/A 05/22/2015    Procedure: COLONOSCOPY WITH PROPOFOL;  Surgeon: Lucilla Lame, MD;  Location: [facility] ENDOSCOPY;  Service: Endoscopy;  Laterality: N/A;  . Inner ear surgery    . Elbow surgery      REMOVAL OF ELBOW SPUR  . Tonsillectomy    . Ptosis repair Right 09/04/2015    Procedure: ptosis repair right eye with biopsy of conjunctiva;  Surgeon: Karle Starch, MD;  Location: [place];  Service: Ophthalmology;  Laterality: Right;  WITH LMA VERY HARD OF HEARING/SPEAK SLOWLY SPECIAL EQUIPMENT:   FRONTALIS  SLING INSTRUMENTS SELFF ROD    History reviewed. No pertinent family history.  Social History   Social History  . Marital Status: Married    Spouse Name: N/A  . Number of Children: N/A  . Years of Education: N/A   Occupational History  . Not on file.   Social History Main Topics  . Smoking status: Current Every Day Smoker -- 1.00 packs/day for 6 years    Types: Cigarettes  . Smokeless tobacco: Never Used  . Alcohol Use: 3.0 oz/week    5 Cans of beer per week  . Drug Use: No  . Sexual Activity:    Partners: Female   Other Topics Concern  . Not on file   Social History Narrative     Current outpatient prescriptions:  .  acetaminophen (TYLENOL) 500 MG tablet, Take 1,000 mg by mouth 2 (two) times daily. am, Disp: , Rfl:  .  cyclobenzaprine (FLEXERIL) 10 MG tablet, TAKE 1 TABLET (10 MG TOTAL) BY MOUTH 3 (THREE) TIMES DAILY AS NEEDED FOR MUSCLE SPASMS., Disp: , Rfl: 0 .  erythromycin (ROMYCIN) ophthalmic ointment, Use a small amount on your sutures 4 times a day for  the next 2 weeks. Switch to Aquaphor ointment should allergy develop., Disp: 3.5 g, Rfl: 3 .  esomeprazole (NEXIUM) 20 MG capsule, Take 20 mg by mouth as needed. , Disp: , Rfl:  .  guaifenesin (HUMIBID E) 400 MG TABS tablet, Take 400 mg by mouth daily. , Disp: , Rfl:  .  HYDROcodone-acetaminophen (NORCO) 5-325 MG tablet, Take 1-2 tablets by mouth every 4 (four) hours as needed for moderate pain., Disp: 15 tablet, Rfl: 0 .  meclizine (ANTIVERT) 25 MG tablet, Take 25 mg by mouth 2 (two) times daily. Reported on 08/29/2015/am and pm if needed, Disp: , Rfl:  .  meloxicam (MOBIC) 15 MG tablet, Take 1 tablet (15 mg total) by mouth daily., Disp: 30 tablet, Rfl: 0 .  metaxalone (SKELAXIN) 800 MG tablet, Take 1 tablet (800 mg total) by mouth 3 (three) times daily., Disp: 30 tablet, Rfl: 0 .  Multiple Vitamin (MULTIVITAMIN WITH MINERALS) TABS tablet, Take 1 tablet by mouth daily. Reported on 08/30/2015, Disp: , Rfl:  .   sildenafil (VIAGRA) 50 MG tablet, Take 1 tablet (50 mg total) by mouth daily as needed for erectile dysfunction. 1 tab PO 30 mins prior to sexual activity, Disp: 10 tablet, Rfl: 0 .  tiZANidine (ZANAFLEX) 4 MG capsule, Take 1 capsule (4 mg total) by mouth 4 (four) times daily as needed for muscle spasms., Disp: 40 capsule, Rfl: 0 .  vitamin B-12 (CYANOCOBALAMIN) 1000 MCG tablet, Take 1,000 mcg by mouth daily. Reported on 08/30/2015, Disp: , Rfl:  .  Vitamin D, Ergocalciferol, (DRISDOL) 50000 UNITS CAPS capsule, Take 1 capsule (50,000 Units total) by mouth once a week., Disp: 12 capsule, Rfl: 0  Allergies  Allergen Reactions  . Ceftriaxone Hives  . Meropenem     During hospitalization 2/22- 08/22/2013 patient had persistent fevers while on meropenem. 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 Known hives with ceftriaxone.     Review of Systems  Musculoskeletal: Positive for myalgias and joint pain.    Objective  Filed Vitals:   11/07/15 1514  BP: 137/85  Pulse: 129  Temp: 98.4 F (36.9 C)  TempSrc: Oral  Resp: 19  Height: 6\' 4"  (1.93 m)  Weight: 216 lb 9.6 oz (98.249 kg)  SpO2: 97%    Physical Exam  Constitutional: He is well-developed, well-nourished, and in no distress.  Musculoskeletal:       Lumbar back: He exhibits tenderness and pain.       Legs: Tenderness over the left gluteal area, posterior thigh, and left posterior lower leg.   Nursing note and vitals reviewed.    Assessment & Plan  1. Muscle strain of left gluteal region, subsequent encounter X-ray of left pelvic area is unremarkable. Pain and muscle spasm likely from strain of left gluteal muscles. We'll start on Soma and high-dose naproxen for 10 days. - carisoprodol (SOMA) 350 MG tablet; Take 1 tablet (350 mg total) by mouth 3 (three) times daily.  Dispense: 30 tablet; Refill: 0 - naproxen (NAPROSYN) 500 MG tablet; Take 1 tablet (500 mg total) by mouth 2 (two) times daily with a meal.  Dispense: 20  tablet; Refill: 0   Hernando Reali Asad A. [facility] 11/07/2015 3:32 PM

## 2015-11-20 ENCOUNTER — Ambulatory Visit (INDEPENDENT_AMBULATORY_CARE_PROVIDER_SITE_OTHER): Payer: Medicaid Other | Admitting: Family Medicine

## 2015-11-20 ENCOUNTER — Encounter: Payer: Self-pay | Admitting: Family Medicine

## 2015-11-20 VITALS — BP 124/72 | HR 109 | Temp 98.0°F | Resp 18 | Wt 212.1 lb

## 2015-11-20 DIAGNOSIS — S76012D Strain of muscle, fascia and tendon of left hip, subsequent encounter: Secondary | ICD-10-CM

## 2015-11-20 DIAGNOSIS — R739 Hyperglycemia, unspecified: Secondary | ICD-10-CM

## 2015-11-20 DIAGNOSIS — S76312D Strain of muscle, fascia and tendon of the posterior muscle group at thigh level, left thigh, subsequent encounter: Secondary | ICD-10-CM | POA: Diagnosis not present

## 2015-11-20 LAB — HEMOGLOBIN A1C
HEMOGLOBIN A1C: 5.8 % — AB (ref <5.7)
Mean Plasma Glucose: 120 mg/dL

## 2015-11-20 MED ORDER — NAPROXEN 500 MG PO TABS: 500.0000 mg | ORAL_TABLET | Freq: Two times a day (BID) | ORAL | Status: DC

## 2015-11-20 MED ORDER — CARISOPRODOL 350 MG PO TABS: 350.0000 mg | ORAL_TABLET | Freq: Three times a day (TID) | ORAL | Status: DC

## 2015-11-20 NOTE — Progress Notes (Signed)
Name: Raymond Gibson   MRN: [id number]    DOB: 1961/02/11   Date:11/20/2015       Progress Note  Subjective  Chief Complaint  Chief Complaint  Patient presents with  . Follow-up    patient is here for his 10day f/u    HPI  Left Hip and Leg Pain: Pt. Presents for his 2 week follow up, he had prolonged left lower back and left hip spasm after he fell backwards out of a chair that he was lounging in. His pain was initially worse (enough that he had to be seen at the [facility] ER). Later on, I obtained an X ray of left hip and thigh which was unremarkable. He was started on Soma and Naprosyn and reports that he feels much better, pain and muscle stiffness is almost resolved, still having intermittent spasms in his left anterior thigh.   Past Medical History  Diagnosis Date  . Allergy   . Meningitis spinal 2015    In Coma for 2 weeks, in Hospital for 2 months.  . Pneumonia     HX OF  . 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 Chronic cough     due to allergies  . Swelling     of legs and feet  . Arthritis     septic arthritis right knee  . Full dentures   . GERD (gastroesophageal reflux disease)   . Complication of anesthesia     During ear pt stated that when anesthesia went into small vein in hand,her arm felt like it was on fire.  [name]     vertigo, dizziness    Past Surgical History  Procedure Laterality Date  . Knee surgery  07/2013  . Foot surgery  1978  . Colonoscopy with propofol N/A 05/22/2015    Procedure: COLONOSCOPY WITH PROPOFOL;  Surgeon: Lucilla Lame, MD;  Location: [facility] ENDOSCOPY;  Service: Endoscopy;  Laterality: N/A;  . Inner ear surgery    . Elbow surgery      REMOVAL OF ELBOW SPUR  . Tonsillectomy    . Ptosis repair Right 09/04/2015    Procedure: ptosis repair right eye with biopsy of conjunctiva;  Surgeon: Karle Starch, MD;  Location: [place];  Service: Ophthalmology;  Laterality: Right;  WITH LMA VERY HARD OF  HEARING/SPEAK SLOWLY SPECIAL EQUIPMENT:   FRONTALIS SLING INSTRUMENTS SELFF ROD    History reviewed. No pertinent family history.  Social History   Social History  . Marital Status: Married    Spouse Name: N/A  . Number of Children: N/A  . Years of Education: N/A   Occupational History  . Not on file.   Social History Main Topics  . Smoking status: Current Every Day Smoker -- 1.00 packs/day for 6 years    Types: Cigarettes  . Smokeless tobacco: Never Used  . Alcohol Use: 3.0 oz/week    5 Cans of beer per week  . Drug Use: No  . Sexual Activity:    Partners: Female   Other Topics Concern  . Not on file   Social History Narrative     Current outpatient prescriptions:  .  acetaminophen (TYLENOL) 500 MG tablet, Take 1,000 mg by mouth 2 (two) times daily. am, Disp: , Rfl:  .  carisoprodol (SOMA) 350 MG tablet, Take 1 tablet (350 mg total) by mouth 3 (three) times daily., Disp: 30 tablet, Rfl: 0 .  erythromycin [facility]) ophthalmic  ointment, Use a small amount on your sutures 4 times a day for the next 2 weeks. Switch to Aquaphor ointment should allergy develop., Disp: 3.5 g, Rfl: 3 .  esomeprazole (NEXIUM) 20 MG capsule, Take 20 mg by mouth as needed. , Disp: , Rfl:  .  guaifenesin (HUMIBID E) 400 MG TABS tablet, Take 400 mg by mouth daily. , Disp: , Rfl:  .  HYDROcodone-acetaminophen (NORCO/VICODIN) 5-325 MG tablet, TAKE 1 TO 2 TABLETS BY MOUTH EVERY 4 HOURS AS NEEDED MODERATE PAIN, Disp: , Rfl: 0 .  meclizine (ANTIVERT) 25 MG tablet, Take 25 mg by mouth 2 (two) times daily. Reported on 08/29/2015/am and pm if needed, Disp: , Rfl:  .  meloxicam (MOBIC) 15 MG tablet, Take 15 mg by mouth daily., Disp: , Rfl: 0 .  Multiple Vitamin (MULTIVITAMIN WITH MINERALS) TABS tablet, Take 1 tablet by mouth daily. Reported on 08/30/2015, Disp: , Rfl:  .  naproxen (NAPROSYN) 500 MG tablet, Take 1 tablet (500 mg total) by mouth 2 (two) times daily with a meal., Disp: 20 tablet, Rfl: 0 .   sildenafil (VIAGRA) 50 MG tablet, Take 1 tablet (50 mg total) by mouth daily as needed for erectile dysfunction. 1 tab PO 30 mins prior to sexual activity, Disp: 10 tablet, Rfl: 0 .  vitamin B-12 (CYANOCOBALAMIN) 1000 MCG tablet, Take 1,000 mcg by mouth daily. Reported on 08/30/2015, Disp: , Rfl:  .  Vitamin D, Ergocalciferol, (DRISDOL) 50000 UNITS CAPS capsule, Take 1 capsule (50,000 Units total) by mouth once a week., Disp: 12 capsule, Rfl: 0  Allergies  Allergen Reactions  . Ceftriaxone Hives  . Meropenem     During hospitalization 2/22- 08/22/2013 patient had persistent fevers while on meropenem. 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 Known hives with ceftriaxone.    Review of Systems  Constitutional: Negative for fever, chills and weight loss.  Musculoskeletal: Positive for myalgias and back pain.    Objective  Filed Vitals:   11/20/15 1622  BP: 124/72  Pulse: 109  Temp: 98 F (36.7 C)  TempSrc: Oral  Resp: 18  Weight: 212 lb 1.6 oz (96.208 kg)  SpO2: 98%    Physical Exam  Constitutional: He is well-developed, well-nourished, and in no distress.  Musculoskeletal:       Left hip: He exhibits tenderness.       Left upper leg: He exhibits tenderness. He exhibits no swelling.       Legs: Tenderness to palpation over the left gluteal area with associated muscle spasm.   Nursing note and vitals reviewed.    Assessment & Plan  1. Muscle strain of left gluteal region, subsequent encounter Improving with muscle relaxant and NSAID therapy, refills on both Soma and naproxen provided. Expect complete symptom resolution - carisoprodol (SOMA) 350 MG tablet; Take 1 tablet (350 mg total) by mouth 3 (three) times daily.  Dispense: 30 tablet; Refill: 0 - naproxen (NAPROSYN) 500 MG tablet; Take 1 tablet (500 mg total) by mouth 2 (two) times daily with a meal.  Dispense: 20 tablet; Refill: 0  2. Hyperglycemia A1c to rule out diabetes - HgB A1c  Kyrstal Monterrosa Asad A.  [facility] 11/20/2015 4:36 PM

## 2015-12-13 ENCOUNTER — Encounter: Payer: Self-pay | Admitting: Family Medicine

## 2015-12-13 ENCOUNTER — Ambulatory Visit (INDEPENDENT_AMBULATORY_CARE_PROVIDER_SITE_OTHER): Payer: Medicaid Other | Admitting: Family Medicine

## 2015-12-13 VITALS — BP 126/70 | HR 110 | Temp 98.7°F | Resp 18 | Ht 76.0 in | Wt 207.4 lb

## 2015-12-13 DIAGNOSIS — M62838 Other muscle spasm: Secondary | ICD-10-CM

## 2015-12-13 NOTE — Progress Notes (Signed)
Name: Raymond Gibson   MRN: [id number]    DOB: 1961-03-15   Date:12/13/2015       Progress Note  Subjective  Chief Complaint  Chief Complaint  Patient presents with  . Spasms    HPI  Muscle Spasm of left Leg: Pt. Is here for referral to physical therapy for muscle spasm of left lower back, left leg, and pain in left buttock area. HE was seen by 'disability doctor' and was recommended to undergo therapy for treatment of muscle spasms. At night, he has burning in his left leg.   Past Medical History  Diagnosis Date  . Allergy   . Meningitis spinal 2015    In Coma for 2 weeks, in Hospital for 2 months.  . Pneumonia     HX OF  . 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 Chronic cough     due to allergies  . Swelling     of legs and feet  . Arthritis     septic arthritis right knee  . Full dentures   . GERD (gastroesophageal reflux disease)   . Complication of anesthesia     During ear pt stated that when anesthesia went into small vein in hand,her arm felt like it was on fire.  Raymond Gibson ear hearing loss     vertigo, dizziness    Past Surgical History  Procedure Laterality Date  . Knee surgery  07/2013  . Foot surgery  1978  . Colonoscopy with propofol N/A 05/22/2015    Procedure: COLONOSCOPY WITH PROPOFOL;  Surgeon: Lucilla Lame, MD;  Location: [facility];  Service: Endoscopy;  Laterality: N/A;  . Inner ear surgery    . Elbow surgery      REMOVAL OF ELBOW SPUR  . Tonsillectomy    . Ptosis repair Right 09/04/2015    Procedure: ptosis repair right eye with biopsy of conjunctiva;  Surgeon: Karle Starch, MD;  Location: [place];  Service: Ophthalmology;  Laterality: Right;  WITH LMA VERY HARD OF HEARING/SPEAK SLOWLY SPECIAL EQUIPMENT:   FRONTALIS SLING INSTRUMENTS SELFF ROD    History reviewed. No pertinent family history.  Social History   Social History  . Marital Status: Married    Spouse Name: N/A  . Number of Children: N/A  . Years of  Education: N/A   Occupational History  . Not on file.   Social History Main Topics  . Smoking status: Current Every Day Smoker -- 1.00 packs/day for 6 years    Types: Cigarettes  . Smokeless tobacco: Never Used  . Alcohol Use: 3.0 oz/week    5 Cans of beer per week  . Drug Use: No  . Sexual Activity:    Partners: Female   Other Topics Concern  . Not on file   Social History Narrative     Current outpatient prescriptions:  .  acetaminophen (TYLENOL) 500 MG tablet, Take 1,000 mg by mouth 2 (two) times daily. am, Disp: , Rfl:  .  carisoprodol (SOMA) 350 MG tablet, Take 1 tablet (350 mg total) by mouth 3 (three) times daily., Disp: 30 tablet, Rfl: 0 .  erythromycin (ROMYCIN) ophthalmic ointment, Use a small amount on your sutures 4 times a day for the next 2 weeks. Switch to Aquaphor ointment should allergy develop., Disp: 3.5 g, Rfl: 3 .  esomeprazole (NEXIUM) 20 MG capsule, Take 20 mg by mouth as needed. , Disp: , Rfl:  .  guaifenesin (HUMIBID E)  400 MG TABS tablet, Take 400 mg by mouth daily. , Disp: , Rfl:  .  HYDROcodone-acetaminophen (NORCO/VICODIN) 5-325 MG tablet, TAKE 1 TO 2 TABLETS BY MOUTH EVERY 4 HOURS AS NEEDED MODERATE PAIN, Disp: , Rfl: 0 .  meclizine (ANTIVERT) 25 MG tablet, Take 25 mg by mouth 2 (two) times daily. Reported on 08/29/2015/am and pm if needed, Disp: , Rfl:  .  meloxicam (MOBIC) 15 MG tablet, Take 15 mg by mouth daily., Disp: , Rfl: 0 .  Multiple Vitamin (MULTIVITAMIN WITH MINERALS) TABS tablet, Take 1 tablet by mouth daily. Reported on 08/30/2015, Disp: , Rfl:  .  naproxen (NAPROSYN) 500 MG tablet, Take 1 tablet (500 mg total) by mouth 2 (two) times daily with a meal., Disp: 20 tablet, Rfl: 0 .  sildenafil (VIAGRA) 50 MG tablet, Take 1 tablet (50 mg total) by mouth daily as needed for erectile dysfunction. 1 tab PO 30 mins prior to sexual activity, Disp: 10 tablet, Rfl: 0 .  vitamin B-12 (CYANOCOBALAMIN) 1000 MCG tablet, Take 1,000 mcg by mouth daily.  Reported on 08/30/2015, Disp: , Rfl:  .  Vitamin D, Ergocalciferol, (DRISDOL) 50000 UNITS CAPS capsule, Take 1 capsule (50,000 Units total) by mouth once a week., Disp: 12 capsule, Rfl: 0  Allergies  Allergen Reactions  . Ceftriaxone Hives  . Meropenem     During hospitalization 2/22- 08/22/2013 patient had persistent fevers while on meropenem. 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 Known hives with ceftriaxone.     Review of Systems  Musculoskeletal: Positive for myalgias, back pain and joint pain.    Objective  Filed Vitals:   12/13/15 1607  BP: 126/70  Pulse: 110  Temp: 98.7 F (37.1 C)  TempSrc: Oral  Resp: 18  Height: 6\' 4"  (1.93 m)  Weight: 207 lb 6.4 oz (94.076 kg)  SpO2: 98%    Physical Exam  Constitutional: He is oriented to person, place, and time and well-developed, well-nourished, and in no distress.  Musculoskeletal:       Legs: Neurological: He is alert and oriented to person, place, and time.  Psychiatric: Mood, memory, affect and judgment normal.  Nursing note and vitals reviewed.     Assessment & Plan  1. Muscle spasm of left lower extremity  - Ambulatory referral to Physical Therapy   Ayrabella Labombard Asad A[facility] 12/13/2015 4:11 PM

## 2016-02-26 ENCOUNTER — Encounter: Payer: Self-pay | Admitting: Family Medicine

## 2016-02-26 ENCOUNTER — Ambulatory Visit (INDEPENDENT_AMBULATORY_CARE_PROVIDER_SITE_OTHER): Payer: Medicaid Other | Admitting: Family Medicine

## 2016-02-26 VITALS — BP 138/73 | HR 101 | Temp 99.1°F | Resp 16 | Wt 226.8 lb

## 2016-02-26 DIAGNOSIS — M545 Low back pain, unspecified: Secondary | ICD-10-CM

## 2016-02-26 NOTE — Progress Notes (Signed)
Name: Raymond Gibson   MRN: [id number]    DOB: 1960-12-31   Date:02/26/2016       Progress Note  Subjective  Chief Complaint  Chief Complaint  Patient presents with  . Follow-up    3 mo    HPI   Pt. Presents for follow up. He had injured his left lower back and left leg after the chair he was reclining in fell backwards and he landed on his back with the recliner on top of him. He had ongoing low back pian and muscle spasm in left lower extremity, which has now resolved after taking NSAIDS and muscle relaxant therapy. Still wakes up at night from time to time with left lower back ache which is usually relieved with ice or heat pack.   Past Medical History:  Diagnosis Date  . Allergy   . Arthritis    septic arthritis right knee  . Chronic cough    due to allergies  . Complication of anesthesia    During ear pt stated that when anesthesia went into small vein in hand,her arm felt like it was on fire.  . Full dentures   . GERD (gastroesophageal reflux disease)   . 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 Inner ear hearing loss    vertigo, dizziness  . Meningitis spinal 2015   In Coma for 2 weeks, in Hospital for 2 months.  . Pneumonia    HX OF  . Swelling    of legs and feet    Past Surgical History:  Procedure Laterality Date  . COLONOSCOPY WITH PROPOFOL N/A 05/22/2015   Procedure: COLONOSCOPY WITH PROPOFOL;  Surgeon: Lucilla Lame, MD;  Location: [facility];  Service: Endoscopy;  Laterality: N/A;  . ELBOW SURGERY     REMOVAL OF ELBOW SPUR  . FOOT SURGERY  1978  . INNER EAR SURGERY    . KNEE SURGERY  07/2013  . PTOSIS REPAIR Right 09/04/2015   Procedure: ptosis repair right eye with biopsy of conjunctiva;  Surgeon: Karle Starch, MD;  Location: [place];  Service: Ophthalmology;  Laterality: Right;  WITH LMA VERY HARD OF HEARING/SPEAK SLOWLY SPECIAL EQUIPMENT:   FRONTALIS SLING INSTRUMENTS SELFF ROD  . TONSILLECTOMY      History reviewed. No  pertinent family history.  Social History   Social History  . Marital status: Married    Spouse name: N/A  . Number of children: N/A  . Years of education: N/A   Occupational History  . Not on file.   Social History Main Topics  . Smoking status: Current Every Day Smoker    Packs/day: 1.00    Years: 6.00    Types: Cigarettes  . Smokeless tobacco: Never Used  . Alcohol use 3.0 oz/week    5 Cans of beer per week  . Drug use: No  . Sexual activity: Yes    Partners: Female   Other Topics Concern  . Not on file   Social History Narrative  . No narrative on file     Current Outpatient Prescriptions:  .  acetaminophen (TYLENOL) 500 MG tablet, Take 1,000 mg by mouth 2 (two) times daily. am, Disp: , Rfl:  .  Multiple Vitamin (MULTIVITAMIN WITH MINERALS) TABS tablet, Take 1 tablet by mouth daily. Reported on 08/30/2015, Disp: , Rfl:  .  Vitamin D, Ergocalciferol, (DRISDOL) 50000 UNITS CAPS capsule, Take 1 capsule (50,000 Units total) by mouth once a week., Disp: 12 capsule,  Rfl: 0 .  vitamin B-12 (CYANOCOBALAMIN) 1000 MCG tablet, Take 1,000 mcg by mouth daily. Reported on 08/30/2015, Disp: , Rfl:   Allergies  Allergen Reactions  . Ceftriaxone Hives  . Meropenem     During hospitalization 2/22- 08/22/2013 patient had persistent fevers while on meropenem. 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 Known hives with ceftriaxone.     Review of Systems  Constitutional: Negative for chills, fever and malaise/fatigue.  Respiratory: Negative for cough and shortness of breath.   Cardiovascular: Negative for chest pain.  Gastrointestinal: Negative for abdominal pain.  Musculoskeletal: Negative for back pain, joint pain, myalgias and neck pain.     Objective  Vitals:   02/26/16 1610  BP: 138/73  Pulse: (!) 101  Resp: 16  Temp: 99.1 F (37.3 C)  TempSrc: Oral  SpO2: 96%  Weight: 226 lb 12.8 oz (102.9 kg)    Physical Exam  Constitutional: He is oriented to person,  place, and time and well-developed, well-nourished, and in no distress.  HENT:  Head: Normocephalic and atraumatic.  Cardiovascular: Normal rate, regular rhythm and normal heart sounds.   No murmur heard. Pulmonary/Chest: Effort normal and breath sounds normal. He has no wheezes.  Musculoskeletal:       Lumbar back: He exhibits tenderness.       Back:  Mild tenderness to palpation along with muscle spasm in the left lower back.  Neurological: He is alert and oriented to person, place, and time.  Nursing note and vitals reviewed.     Assessment & Plan  1. Left-sided low back pain without sciatica Significantly improved, on no pharmacotherapy. Reassured   Raymond Gibson Asad A[facility] 02/26/2016 4:46 PM

## 2016-04-29 ENCOUNTER — Ambulatory Visit (INDEPENDENT_AMBULATORY_CARE_PROVIDER_SITE_OTHER): Payer: Medicaid Other | Admitting: Family Medicine

## 2016-04-29 ENCOUNTER — Encounter: Payer: Self-pay | Admitting: Family Medicine

## 2016-04-29 DIAGNOSIS — Z0000 Encounter for general adult medical examination without abnormal findings: Secondary | ICD-10-CM

## 2016-04-29 NOTE — Progress Notes (Signed)
Name: Raymond Gibson   MRN: [id number]    DOB: 15-Aug-1960   Date:04/29/2016       Progress Note  Subjective  Chief Complaint  Chief Complaint  Patient presents with  . Annual Exam    CPE    HPI  Pt. Presents for a Complete Physical Exam.  Colonoscopy was completed in 2016, repeat in 5 years. DRE and PSA completed last year   Past Medical History:  Diagnosis Date  . Allergy   . Arthritis    septic arthritis right knee  . Chronic cough    due to allergies  . Complication of anesthesia    During ear pt stated that when anesthesia went into small vein in hand,her arm felt like it was on fire.  . Full dentures   . GERD (gastroesophageal reflux disease)   . 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 Inner ear hearing loss    vertigo, dizziness  . Meningitis spinal 2015   In Coma for 2 weeks, in Hospital for 2 months.  . Pneumonia    HX OF  . Swelling    of legs and feet    Past Surgical History:  Procedure Laterality Date  . COLONOSCOPY WITH PROPOFOL N/A 05/22/2015   Procedure: COLONOSCOPY WITH PROPOFOL;  Surgeon: Lucilla Lame, MD;  Location: [facility];  Service: Endoscopy;  Laterality: N/A;  . ELBOW SURGERY     REMOVAL OF ELBOW SPUR  . FOOT SURGERY  1978  . INNER EAR SURGERY    . KNEE SURGERY  07/2013  . PTOSIS REPAIR Right 09/04/2015   Procedure: ptosis repair right eye with biopsy of conjunctiva;  Surgeon: Karle Starch, MD;  Location: [place];  Service: Ophthalmology;  Laterality: Right;  WITH LMA VERY HARD OF HEARING/SPEAK SLOWLY SPECIAL EQUIPMENT:   FRONTALIS SLING INSTRUMENTS SELFF ROD  . TONSILLECTOMY      History reviewed. No pertinent family history.  Social History   Social History  . Marital status: Married    Spouse name: N/A  . Number of children: N/A  . Years of education: N/A   Occupational History  . Not on file.   Social History Main Topics  . Smoking status: Current Every Day Smoker    Packs/day: 1.00     Years: 6.00    Types: Cigarettes  . Smokeless tobacco: Never Used  . Alcohol use 3.0 oz/week    5 Cans of beer per week  . Drug use: No  . Sexual activity: Yes    Partners: Female   Other Topics Concern  . Not on file   Social History Narrative  . No narrative on file     Current Outpatient Prescriptions:  .  acetaminophen (TYLENOL) 500 MG tablet, Take 1,000 mg by mouth 2 (two) times daily. am, Disp: , Rfl:  .  Multiple Vitamin (MULTIVITAMIN WITH MINERALS) TABS tablet, Take 1 tablet by mouth daily. Reported on 08/30/2015, Disp: , Rfl:  .  Vitamin D, Ergocalciferol, (DRISDOL) 50000 UNITS CAPS capsule, Take 1 capsule (50,000 Units total) by mouth once a week., Disp: 12 capsule, Rfl: 0 .  vitamin B-12 (CYANOCOBALAMIN) 1000 MCG tablet, Take 1,000 mcg by mouth daily. Reported on 08/30/2015, Disp: , Rfl:   Allergies  Allergen Reactions  . Ceftriaxone Hives  . Meropenem     During hospitalization 2/22- 08/22/2013 patient had persistent fevers while on meropenem. 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 Known hives with ceftriaxone.  Review of Systems  Constitutional: Negative for chills, fever and malaise/fatigue.  HENT: Positive for congestion and sinus pain. Negative for ear pain and hearing loss.   Eyes: Negative for blurred vision and double vision.  Respiratory: Positive for cough and sputum production. Negative for shortness of breath.   Cardiovascular: Negative for chest pain, palpitations and leg swelling.  Gastrointestinal: Negative for blood in stool, constipation, heartburn, melena, nausea and vomiting.  Genitourinary: Negative for dysuria and hematuria.  Musculoskeletal: Positive for back pain and myalgias. Negative for joint pain.  Neurological: Negative for dizziness.  Psychiatric/Behavioral: Negative for depression. The patient is not nervous/anxious and does not have insomnia.     Objective  Vitals:   04/29/16 1051  BP: 134/80  Pulse: 99  Resp: 17   Temp: 97.2 F (36.2 C)  TempSrc: Oral  SpO2: 95%  Weight: 232 lb 11.2 oz (105.6 kg)  Height: 6\' 4"  (1.93 m)    Physical Exam  Constitutional: He is oriented to person, place, and time and well-developed, well-nourished, and in no distress.  HENT:  Head: Normocephalic and atraumatic.  Right Ear: Tympanic membrane and ear canal normal.  Left Ear: Tympanic membrane and ear canal normal.  Nose: Right sinus exhibits no maxillary sinus tenderness and no frontal sinus tenderness. Left sinus exhibits no maxillary sinus tenderness and no frontal sinus tenderness.  Mouth/Throat: Posterior oropharyngeal erythema present. No oropharyngeal exudate.  Pulmonary/Chest: Effort normal. No respiratory distress. He has no decreased breath sounds. He has no wheezes. He has no rhonchi.  Abdominal: Soft. Bowel sounds are normal. There is no tenderness.  Genitourinary: Rectum normal. Prostate is enlarged (mildly enlarged).  Musculoskeletal:       Right ankle: He exhibits no swelling.       Left ankle: He exhibits no swelling.  Neurological: He is alert and oriented to person, place, and time.  Psychiatric: Mood, memory, affect and judgment normal.  Nursing note and vitals reviewed.       Assessment & Plan  1. Annual physical exam  - Lipid Profile - COMPLETE METABOLIC PANEL WITH GFR - CBC with Differential - PSA - TSH - Vitamin D (25 hydroxy)   Terilynn Buresh Asad A. [facility] 04/29/2016 10:58 AM

## 2016-04-30 LAB — COMPLETE METABOLIC PANEL WITH GFR
ALK PHOS: 60 U/L (ref 40–115)
ALT: 13 U/L (ref 9–46)
AST: 18 U/L (ref 10–35)
Albumin: 3.9 g/dL (ref 3.6–5.1)
BILIRUBIN TOTAL: 0.6 mg/dL (ref 0.2–1.2)
BUN: 15 mg/dL (ref 7–25)
CO2: 27 mmol/L (ref 20–31)
Calcium: 9.5 mg/dL (ref 8.6–10.3)
Chloride: 106 mmol/L (ref 98–110)
Creat: 1.35 mg/dL — ABNORMAL HIGH (ref 0.70–1.33)
GFR, EST AFRICAN AMERICAN: 67 mL/min (ref >=60)
GFR, EST NON AFRICAN AMERICAN: 58 mL/min — AB (ref >=60)
Glucose, Bld: 107 mg/dL — ABNORMAL HIGH (ref 65–99)
Potassium: 4.5 mmol/L (ref 3.5–5.3)
Sodium: 140 mmol/L (ref 135–146)
TOTAL PROTEIN: 6.6 g/dL (ref 6.1–8.1)

## 2016-04-30 LAB — CBC WITH DIFFERENTIAL/PLATELET
BASOS PCT: 0 %
Basophils Absolute: 0 cells/uL (ref 0–200)
EOS ABS: 74 {cells}/uL (ref 15–500)
EOS PCT: 1 %
HCT: 46.1 % (ref 38.5–50.0)
Hemoglobin: 15.5 g/dL (ref 13.2–17.1)
Lymphocytes Relative: 32 %
Lymphs Abs: 2368 cells/uL (ref 850–3900)
MCH: 31.6 pg (ref 27.0–33.0)
MCHC: 33.6 g/dL (ref 32.0–36.0)
MCV: 94.1 fL (ref 80.0–100.0)
MONOS PCT: 8 %
MPV: 8.7 fL (ref 7.5–12.5)
Monocytes Absolute: 592 cells/uL (ref 200–950)
NEUTROS ABS: 4366 {cells}/uL (ref 1500–7800)
Neutrophils Relative %: 59 %
PLATELETS: 304 10*3/uL (ref 140–400)
RBC: 4.9 MIL/uL (ref 4.20–5.80)
RDW: 13.9 % (ref 11.0–15.0)
WBC: 7.4 10*3/uL (ref 3.8–10.8)

## 2016-04-30 LAB — LIPID PANEL
CHOL/HDL RATIO: 2.3 ratio (ref <5.0)
Cholesterol: 166 mg/dL (ref <200)
HDL: 72 mg/dL (ref >40)
LDL CALC: 82 mg/dL (ref <100)
TRIGLYCERIDES: 58 mg/dL (ref <150)
VLDL: 12 mg/dL (ref <30)

## 2016-04-30 LAB — PSA: PSA: 1 ng/mL (ref <=4.0)

## 2016-04-30 LAB — TSH: TSH: 1.17 m[IU]/L (ref 0.40–4.50)

## 2016-05-01 LAB — VITAMIN D 25 HYDROXY (VIT D DEFICIENCY, FRACTURES): VIT D 25 HYDROXY: 41 ng/mL (ref 30–100)

## 2017-04-30 ENCOUNTER — Ambulatory Visit: Payer: Medicaid Other | Admitting: Family Medicine

## 2017-04-30 ENCOUNTER — Encounter: Payer: Self-pay | Admitting: Family Medicine

## 2017-04-30 VITALS — BP 118/66 | HR 86 | Temp 97.6°F | Resp 16 | Ht 76.0 in | Wt 243.6 lb

## 2017-04-30 DIAGNOSIS — Z125 Encounter for screening for malignant neoplasm of prostate: Secondary | ICD-10-CM

## 2017-04-30 DIAGNOSIS — Z1159 Encounter for screening for other viral diseases: Secondary | ICD-10-CM

## 2017-04-30 DIAGNOSIS — Z0000 Encounter for general adult medical examination without abnormal findings: Secondary | ICD-10-CM | POA: Diagnosis not present

## 2017-04-30 DIAGNOSIS — H6122 Impacted cerumen, left ear: Secondary | ICD-10-CM

## 2017-04-30 NOTE — Progress Notes (Signed)
Name: Raymond Gibson   MRN: [id number]    DOB: [birth year]   Date:04/30/2017       Progress Note  Subjective  Chief Complaint  Chief Complaint  Patient presents with  . Annual Exam    HPI  Pt. Is here for a Complete Physical Exam.  He had screening colonoscopy in 2016, repeat in 2021. He is due for prostate cancer screening and Hepatitis C screening.    Past Medical History:  Diagnosis Date  . Allergy   . Arthritis    septic arthritis right knee  . Chronic cough    due to allergies  . Complication of anesthesia    During ear pt stated that when anesthesia went into small vein in hand,her arm felt like it was on fire.  . Full dentures   . GERD (gastroesophageal reflux disease)   . 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 Inner ear hearing loss    vertigo, dizziness  . Meningitis spinal 2015   In Coma for 2 weeks, in Hospital for 2 months.  . Pneumonia    HX OF  . Swelling    of legs and feet    Past Surgical History:  Procedure Laterality Date  . COLONOSCOPY WITH PROPOFOL N/A 05/22/2015   Procedure: COLONOSCOPY WITH PROPOFOL;  Surgeon: Lucilla Lame, MD;  Location: [facility];  Service: Endoscopy;  Laterality: N/A;  . ELBOW SURGERY     REMOVAL OF ELBOW SPUR  . FOOT SURGERY  1978  . INNER EAR SURGERY    . KNEE SURGERY  07/2013  . PTOSIS REPAIR Right 09/04/2015   Procedure: ptosis repair right eye with biopsy of conjunctiva;  Surgeon: Karle Starch, MD;  Location: [place];  Service: Ophthalmology;  Laterality: Right;  WITH LMA VERY HARD OF HEARING/SPEAK SLOWLY SPECIAL EQUIPMENT:   FRONTALIS SLING INSTRUMENTS SELFF ROD  . TONSILLECTOMY      Family History  Problem Relation Age of Onset  . Hypertension Mother   . Stroke Father     Social History   Socioeconomic History  . Marital status: Married    Spouse name: Not on file  . Number of children: Not on file  . Years of education: Not on file  . Highest education level: Not on file   Social Needs  . Financial resource strain: Not on file  . Food insecurity - worry: Not on file  . Food insecurity - inability: Not on file  . Transportation needs - medical: Not on file  . Transportation needs - non-medical: Not on file  Occupational History  . Not on file  Tobacco Use  . Smoking status: Current Every Day Smoker    Packs/day: 1.00    Years: 6.00    Pack years: 6.00    Types: Cigarettes  . Smokeless tobacco: Never Used  Substance and Sexual Activity  . Alcohol use: Yes    Alcohol/week: 3.0 oz    Types: 5 Cans of beer per week  . Drug use: No  . Sexual activity: Yes    Partners: Female  Other Topics Concern  . Not on file  Social History Narrative  . Not on file     Current Outpatient Medications:  .  acetaminophen (TYLENOL) 500 MG tablet, Take 1,000 mg by mouth 2 (two) times daily. am, Disp: , Rfl:  .  Multiple Vitamin (MULTIVITAMIN WITH MINERALS) TABS tablet, Take 1 tablet by mouth daily. Reported on 08/30/2015, Disp: ,  Rfl:  .  vitamin B-12 (CYANOCOBALAMIN) 1000 MCG tablet, Take 1,000 mcg by mouth daily. Reported on 08/30/2015, Disp: , Rfl:  .  Vitamin D, Ergocalciferol, (DRISDOL) 50000 UNITS CAPS capsule, Take 1 capsule (50,000 Units total) by mouth once a week. (Patient not taking: Reported on 04/30/2017), Disp: 12 capsule, Rfl: 0  Allergies  Allergen Reactions  . Ceftriaxone Hives  . Meropenem     During hospitalization 2/22- 08/22/2013 patient had persistent fevers while on meropenem. 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 Known hives with ceftriaxone.     Review of Systems  Constitutional: Negative for chills, fever and malaise/fatigue.  HENT: Negative for congestion, ear pain, sinus pain and sore throat.   Eyes: Negative for blurred vision and double vision.  Respiratory: Positive for cough (recovered from a cold 2 weeks ago). Negative for sputum production, shortness of breath and wheezing.   Cardiovascular: Negative for chest pain,  palpitations and leg swelling.  Gastrointestinal: Negative for abdominal pain, blood in stool, constipation, diarrhea, nausea and vomiting.  Genitourinary: Negative for dysuria, hematuria and urgency.  Musculoskeletal: Negative for back pain and neck pain.  Neurological: Positive for dizziness (occasional dizziness, has vertigo). Negative for headaches.  Psychiatric/Behavioral: Negative for depression. The patient is not nervous/anxious and does not have insomnia.      Objective  Vitals:   04/30/17 0905  BP: 118/66  Pulse: 86  Resp: 16  Temp: 97.6 F (36.4 C)  TempSrc: Oral  SpO2: 95%  Weight: 243 lb 9.6 oz (110.5 kg)  Height: 6\' 4"  (1.93 m)    Physical Exam  Constitutional: He is oriented to person, place, and time and well-developed, well-nourished, and in no distress.  HENT:  Head: Normocephalic and atraumatic.  Left Ear: No drainage or swelling.  Mouth/Throat: No posterior oropharyngeal erythema.  Left ear canal with cerumen impaction  Neck: Neck supple. No thyroid mass present.  Cardiovascular: Normal rate, regular rhythm, S1 normal, S2 normal and normal heart sounds.  No murmur heard. Pulmonary/Chest: Effort normal and breath sounds normal. He has no wheezes. He has no rhonchi.  Abdominal: Soft. Bowel sounds are normal. There is no tenderness.  Genitourinary: Prostate normal. Prostate is not enlarged and not tender.  Musculoskeletal:       Right ankle: He exhibits no swelling.       Left ankle: He exhibits no swelling.  Neurological: He is alert and oriented to person, place, and time.  Skin: Skin is warm, dry and intact.  Psychiatric: Mood, memory, affect and judgment normal.  Nursing note and vitals reviewed.       Assessment & Plan  1. Annual physical exam Obtain age-appropriate lab screenings.   - CBC with Differential/Platelet - COMPLETE METABOLIC PANEL WITH GFR - Lipid panel - TSH - VITAMIN D 25 Hydroxy (Vit-D Deficiency, Fractures)  2.  Screening for prostate cancer  - PSA  3. Need for hepatitis C screening test  - Hepatitis C antibody  4. 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   - Ambulatory referral to ENT   Lane Kjos Asad A. [facility] 04/30/2017 9:28 AM

## 2017-05-01 LAB — VITAMIN D 25 HYDROXY (VIT D DEFICIENCY, FRACTURES): Vit D, 25-Hydroxy: 33 ng/mL (ref 30–100)

## 2017-05-01 LAB — CBC WITH DIFFERENTIAL/PLATELET
BASOS ABS: 29 {cells}/uL (ref 0–200)
Basophils Relative: 0.4 %
EOS ABS: 72 {cells}/uL (ref 15–500)
Eosinophils Relative: 1 %
HEMATOCRIT: 43.1 % (ref 38.5–50.0)
HEMOGLOBIN: 14.7 g/dL (ref 13.2–17.1)
LYMPHS ABS: 2002 {cells}/uL (ref 850–3900)
MCH: 31.8 pg (ref 27.0–33.0)
MCHC: 34.1 g/dL (ref 32.0–36.0)
MCV: 93.3 fL (ref 80.0–100.0)
MONOS PCT: 9.5 %
MPV: 9 fL (ref 7.5–12.5)
NEUTROS ABS: 4414 {cells}/uL (ref 1500–7800)
NEUTROS PCT: 61.3 %
Platelets: 298 10*3/uL (ref 140–400)
RBC: 4.62 10*6/uL (ref 4.20–5.80)
RDW: 12.1 % (ref 11.0–15.0)
Total Lymphocyte: 27.8 %
WBC mixed population: 684 cells/uL (ref 200–950)
WBC: 7.2 10*3/uL (ref 3.8–10.8)

## 2017-05-01 LAB — COMPLETE METABOLIC PANEL WITH GFR
AG RATIO: 1.6 (calc) (ref 1.0–2.5)
ALT: 12 U/L (ref 9–46)
AST: 16 U/L (ref 10–35)
Albumin: 4.1 g/dL (ref 3.6–5.1)
Alkaline phosphatase (APISO): 63 U/L (ref 40–115)
BUN: 12 mg/dL (ref 7–25)
CO2: 29 mmol/L (ref 20–32)
CREATININE: 1.2 mg/dL (ref 0.70–1.33)
Calcium: 9.6 mg/dL (ref 8.6–10.3)
Chloride: 105 mmol/L (ref 98–110)
GFR, EST AFRICAN AMERICAN: 77 mL/min/{1.73_m2} (ref >=60)
GFR, EST NON AFRICAN AMERICAN: 66 mL/min/{1.73_m2} (ref >=60)
Globulin: 2.6 g/dL (calc) (ref 1.9–3.7)
Glucose, Bld: 98 mg/dL (ref 65–99)
Potassium: 3.9 mmol/L (ref 3.5–5.3)
Sodium: 139 mmol/L (ref 135–146)
TOTAL PROTEIN: 6.7 g/dL (ref 6.1–8.1)
Total Bilirubin: 0.4 mg/dL (ref 0.2–1.2)

## 2017-05-01 LAB — HEPATITIS C ANTIBODY
HEP C AB: NONREACTIVE
SIGNAL TO CUT-OFF: 0.07 (ref <1.00)

## 2017-05-01 LAB — LIPID PANEL
CHOL/HDL RATIO: 3 (calc) (ref <5.0)
Cholesterol: 159 mg/dL (ref <200)
HDL: 53 mg/dL (ref >40)
LDL CHOLESTEROL (CALC): 90 mg/dL
NON-HDL CHOLESTEROL (CALC): 106 mg/dL (ref <130)
TRIGLYCERIDES: 70 mg/dL (ref <150)

## 2017-05-01 LAB — PSA: PSA: 1.4 ng/mL (ref <=4.0)

## 2017-05-01 LAB — TSH: TSH: 1.16 m[IU]/L (ref 0.40–4.50)

## 2018-04-04 IMAGING — CR DG HIP (WITH OR WITHOUT PELVIS) 2-3V*L*
3 series · 3 of 3 positions shown · non-contrast
Comparison: None.

CLINICAL DATA: Fell out of chair, left hip pain

EXAM:
DG HIP (WITH OR WITHOUT PELVIS) 2-3V LEFT

[pelvis ap]
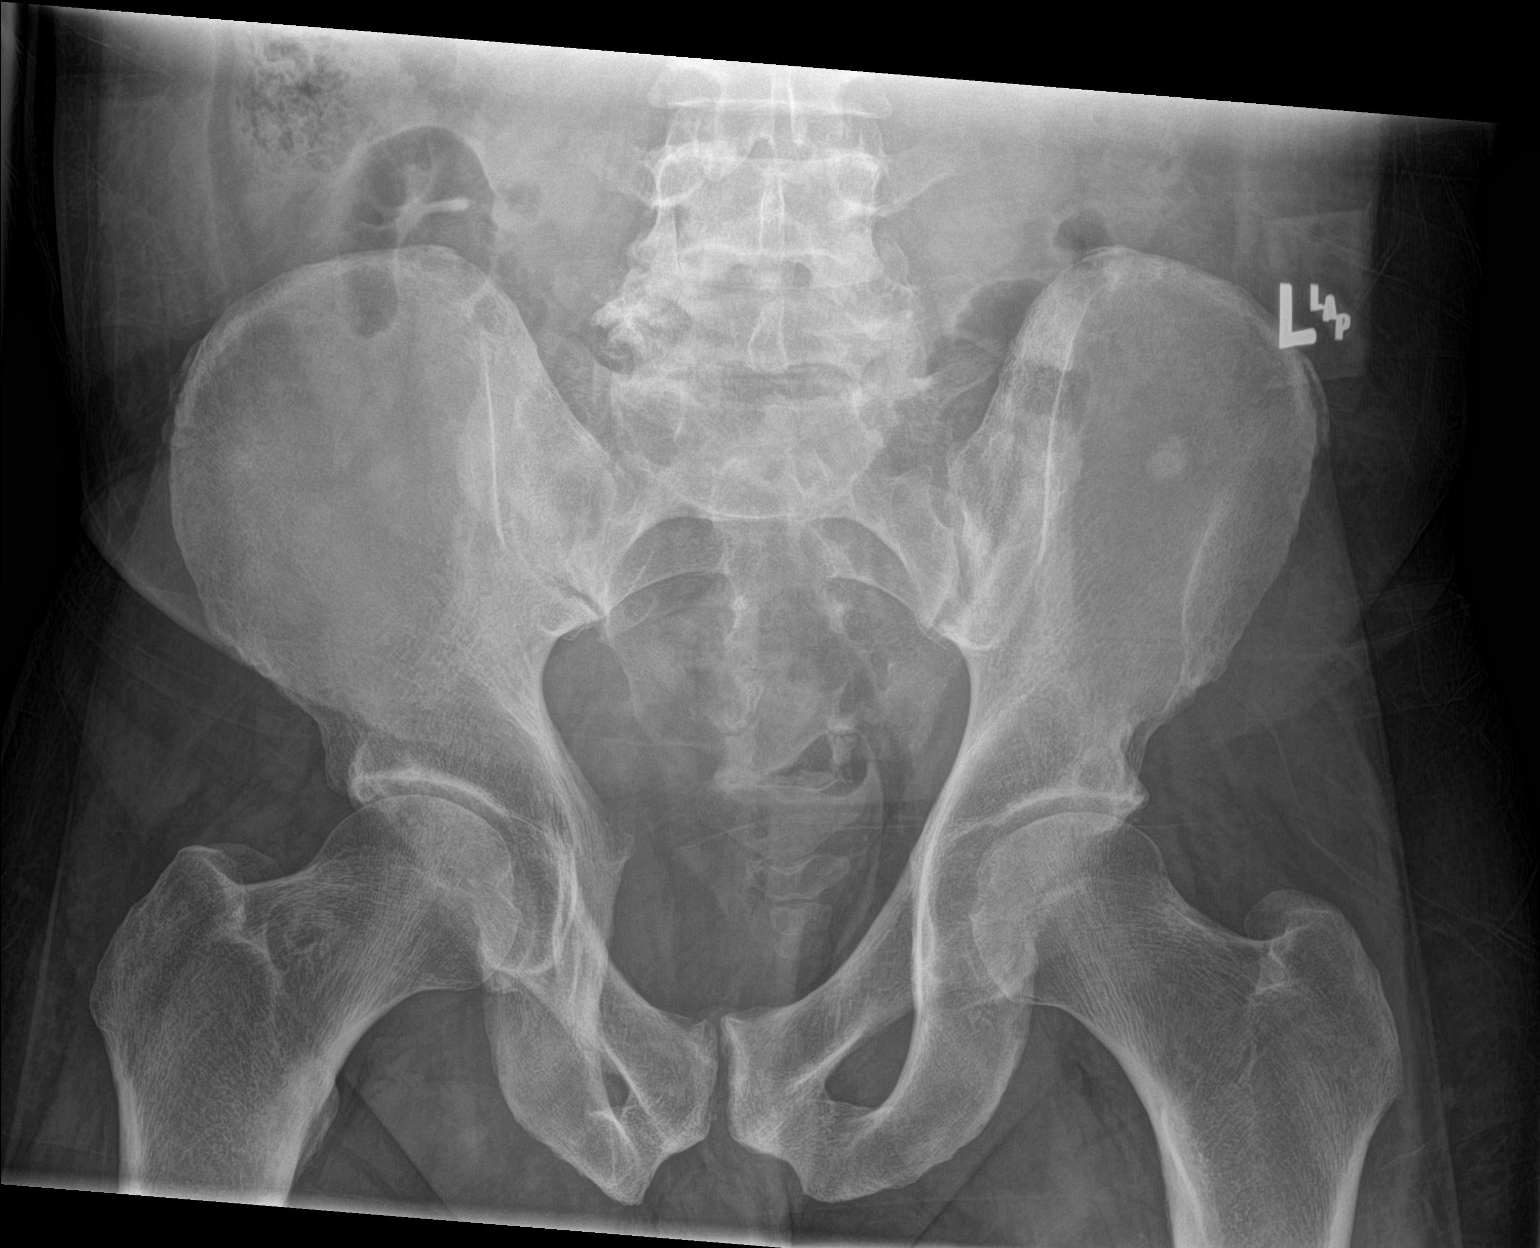

[hip ap]
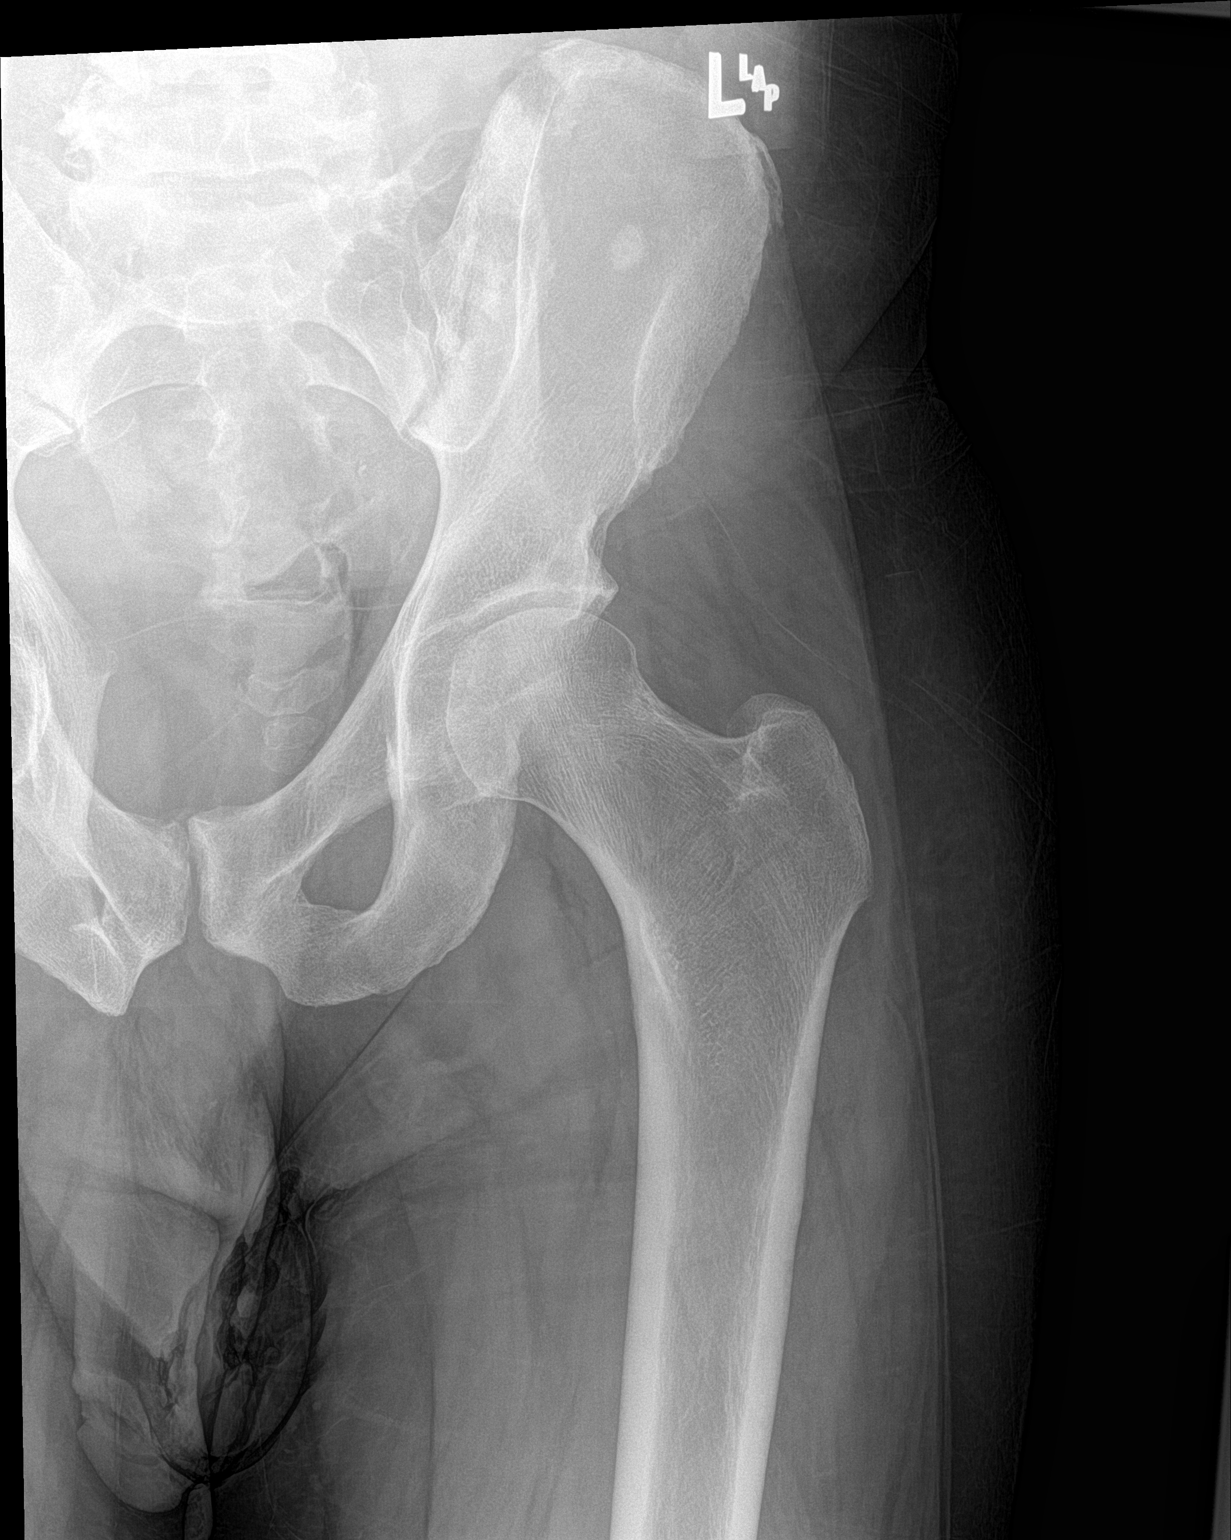

[hip lat]
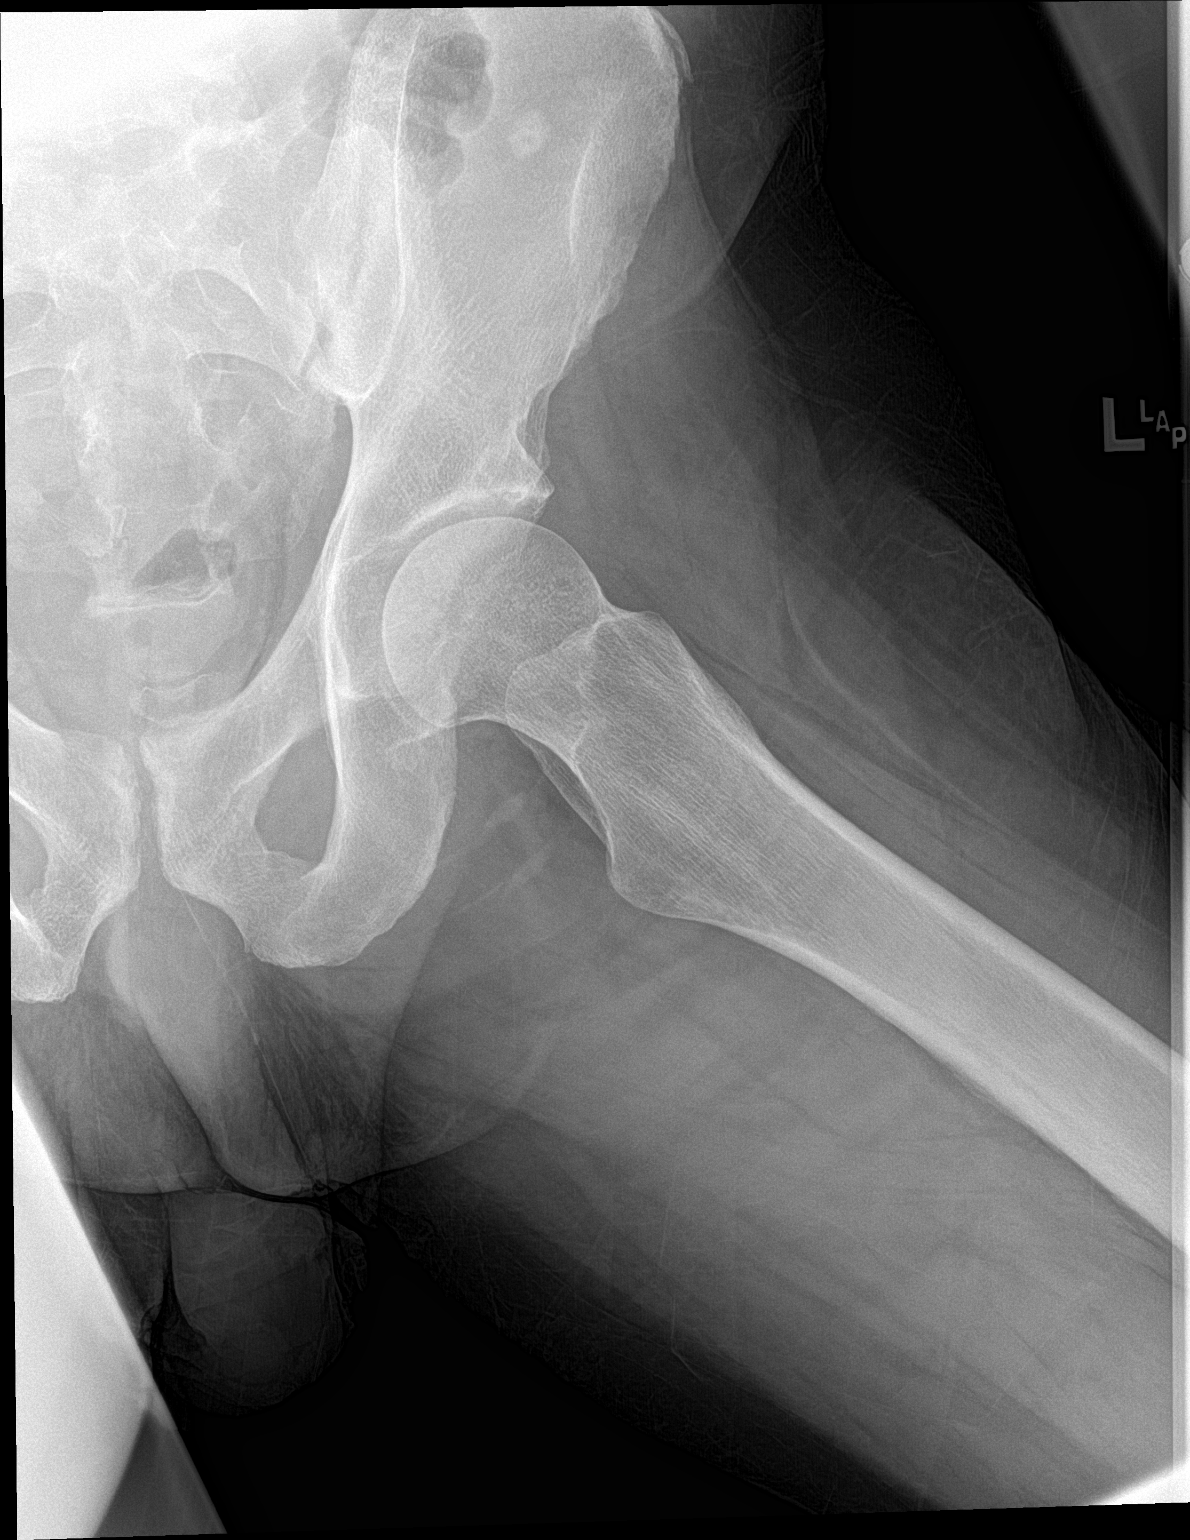

[3 of 3 positions shown; findings below may reference images not displayed]

FINDINGS: Three views of the left hip submitted. No acute fracture or
subluxation. No radiopaque foreign body. There is deformity of right
pelvis in acetabular region. This may be developmental in nature or
due to prior injury. Clinical correlation is necessary. Mild
degenerative changes pubic symphysis.
IMPRESSION: No acute fracture or subluxation. No radiopaque foreign body. There
is deformity of right pelvis in acetabular region. This may be
developmental in nature or due to prior injury. Clinical correlation
is necessary. Mild degenerative changes pubic symphysis.

## 2018-05-03 ENCOUNTER — Encounter: Payer: Self-pay | Admitting: Family Medicine

## 2018-05-03 ENCOUNTER — Ambulatory Visit: Payer: Medicaid Other | Admitting: Family Medicine

## 2018-05-03 VITALS — BP 108/62 | HR 75 | Temp 97.6°F | Resp 16 | Ht 75.25 in | Wt 239.2 lb

## 2018-05-03 DIAGNOSIS — Z23 Encounter for immunization: Secondary | ICD-10-CM | POA: Diagnosis not present

## 2018-05-03 DIAGNOSIS — E559 Vitamin D deficiency, unspecified: Secondary | ICD-10-CM

## 2018-05-03 DIAGNOSIS — Z114 Encounter for screening for human immunodeficiency virus [HIV]: Secondary | ICD-10-CM

## 2018-05-03 DIAGNOSIS — Z113 Encounter for screening for infections with a predominantly sexual mode of transmission: Secondary | ICD-10-CM

## 2018-05-03 DIAGNOSIS — Z125 Encounter for screening for malignant neoplasm of prostate: Secondary | ICD-10-CM

## 2018-05-03 DIAGNOSIS — Z13220 Encounter for screening for lipoid disorders: Secondary | ICD-10-CM

## 2018-05-03 DIAGNOSIS — R739 Hyperglycemia, unspecified: Secondary | ICD-10-CM

## 2018-05-03 DIAGNOSIS — Z0000 Encounter for general adult medical examination without abnormal findings: Secondary | ICD-10-CM

## 2018-05-03 DIAGNOSIS — Z130 Encounter for screening for diseases of the blood and blood-forming organs and certain disorders involving the immune mechanism: Secondary | ICD-10-CM

## 2018-05-03 NOTE — Progress Notes (Signed)
Name: Raymond Gibson   MRN: [id number]    DOB: 1960-06-09   Date:05/03/2018       Progress Note  Subjective  Chief Complaint  Chief Complaint  Patient presents with  . Annual Exam  . Immunizations    Tdap & patient declines flu shot  . Labs Only    patient is fasting    HPI  Patient presents for annual CPE .  USPSTF grade A and B recommendations:  Diet: discussed life style  Exercise: discussed regular physical active   Depression:  Depression screen [facility] 2/9 05/03/2018 04/30/2017 04/29/2016 02/26/2016 12/13/2015  Decreased Interest 0 0 0 0 0  Down, Depressed, Hopeless 0 0 0 0 0  PHQ - 2 Score 0 0 0 0 0  Altered sleeping 0 - - - -  Tired, decreased energy 0 - - - -  Change in appetite 0 - - - -  Feeling bad or failure about yourself  0 - - - -  Trouble concentrating 0 - - - -  Moving slowly or fidgety/restless 0 - - - -  Suicidal thoughts 0 - - - -  PHQ-9 Score 0 - - - -  Difficult doing work/chores Not difficult at all - - - -    Hypertension:  BP Readings from Last 3 Encounters:  05/03/18 108/62  04/30/17 118/66  04/29/16 134/80    Obesity: Wt Readings from Last 3 Encounters:  05/03/18 239 lb 3.2 oz (108.5 kg)  04/30/17 243 lb 9.6 oz (110.5 kg)  04/29/16 232 lb 11.2 oz (105.6 kg)   BMI Readings from Last 3 Encounters:  05/03/18 29.70 kg/m  04/30/17 29.65 kg/m  04/29/16 28.33 kg/m     Lipids:  Lab Results  Component Value Date   CHOL 159 04/30/2017   CHOL 166 04/29/2016   CHOL 175 04/25/2015   Lab Results  Component Value Date   HDL 53 04/30/2017   HDL 72 04/29/2016   HDL 62 04/25/2015   Lab Results  Component Value Date   LDLCALC 90 04/30/2017   LDLCALC 82 04/29/2016   LDLCALC 94 04/25/2015   Lab Results  Component Value Date   TRIG 70 04/30/2017   TRIG 58 04/29/2016   TRIG 94 04/25/2015   Lab Results  Component Value Date   CHOLHDL 3.0 04/30/2017   CHOLHDL 2.3 04/29/2016   CHOLHDL 2.8 04/25/2015   No results found for:  LDLDIRECT Glucose:  Glucose  Date Value Ref Range Status  07/20/2013 114 (H) 65 - 99 mg/dL Final  07/19/2013 125 (H) 65 - 99 mg/dL Final  07/18/2013 116 (H) 65 - 99 mg/dL Final   Glucose, Bld  Date Value Ref Range Status  04/30/2017 98 65 - 99 mg/dL Final    Comment:    .            Fasting reference interval .   04/29/2016 107 (H) 65 - 99 mg/dL Final  11/06/2015 123 (H) 65 - 99 mg/dL Final      [facility] Visit from 05/03/2018 in [facility]  AUDIT-C Score  3      Divorced STD testing and prevention (HIV/chl/gon/syphilis): today , he does not want gonorrhea or chlamydia test  Hep C: up to date   Skin cancer: discussed atypical lesions  Colorectal cancer: up to date Prostate cancer: recheck PSA level going up  Lab Results  Component Value Date   PSA 1.4 04/30/2017   PSA 1.0 04/29/2016    IPSS  Questionnaire (AUA-7): Over the past month.   1)  How often have you had a sensation of not emptying your bladder completely after you finish urinating?  0 - Not at all  2)  How often have you had to urinate again less than two hours after you finished urinating? 1 - Less than 1 time in 5  3)  How often have you found you stopped and started again several times when you urinated?  0 - Not at all  4) How difficult have you found it to postpone urination?  0 - Not at all  5) How often have you had a weak urinary stream?  0 - Not at all  6) How often have you had to push or strain to begin urination?  0 - Not at all  7) How many times did you most typically get up to urinate from the time you went to bed until the time you got up in the morning?  1 - 1 time  Total score:  0-7 mildly symptomatic   8-19 moderately symptomatic   20-35 severely symptomatic    Lung cancer:   Low Dose CT Chest recommended if Age 63-80 years, 30 pack-year currently smoking OR have quit w/in 15years. Patient does not qualify.   ECG:  2017  Advanced Care Planning: A voluntary discussion  about advance care planning including the explanation and discussion of advance directives.  Discussed health care proxy and Living will, and the patient was able to identify a health care proxy as [place]   Patient does have a living will at present time.  Patient Active Problem List   Diagnosis Date Noted  . Hyperglycemia 11/20/2015  . Vitamin D insufficiency 08/30/2015  . ED (erectile dysfunction) 08/30/2015  . Benign neoplasm of transverse colon   . Benign neoplasm of sigmoid colon   . Snores 01/18/2014  . GERD (gastroesophageal reflux disease) 01/18/2014  . Bilateral sensorineural hearing loss 08/30/2013  . Buzzing in ear 08/30/2013    Past Surgical History:  Procedure Laterality Date  . COLONOSCOPY WITH PROPOFOL N/A 05/22/2015   Procedure: COLONOSCOPY WITH PROPOFOL;  Surgeon: Lucilla Lame, MD;  Location: [facility] ENDOSCOPY;  Service: Endoscopy;  Laterality: N/A;  . ELBOW SURGERY     REMOVAL OF ELBOW SPUR  . FOOT SURGERY  1978  . INNER EAR SURGERY    . KNEE SURGERY  07/2013  . PTOSIS REPAIR Right 09/04/2015   Procedure: ptosis repair right eye with biopsy of conjunctiva;  Surgeon: Karle Starch, MD;  Location: [place];  Service: Ophthalmology;  Laterality: Right;  WITH LMA VERY HARD OF HEARING/SPEAK SLOWLY SPECIAL EQUIPMENT:   FRONTALIS SLING INSTRUMENTS SELFF ROD  . TONSILLECTOMY      Family History  Problem Relation Age of Onset  . Hypertension Mother   . Stroke Father     Social History   Socioeconomic History  . Marital status: Divorced    Spouse name: Not on file  . Number of children: 0  . Years of education: Not on file  . Highest education level: 12th grade  Occupational History  . Occupation: Disabled  Social Needs  . Financial resource strain: Very hard  . Food insecurity:    Worry: Never true    Inability: Never true  . Transportation needs:    Medical: No    Non-medical: No  Tobacco Use  . Smoking status: Current Every Day  Smoker    Packs/day: 1.00  Years: 6.00    Pack years: 6.00    Types: Cigarettes  . Smokeless tobacco: Never Used  Substance and Sexual Activity  . Alcohol use: Yes    Alcohol/week: 5.0 standard drinks    Types: 5 Cans of beer per week  . Drug use: No  . Sexual activity: Yes    Partners: Female  Lifestyle  . Physical activity:    Days per week: 4 days    Minutes per session: 120 min  . Stress: To some extent  Relationships  . Social connections:    Talks on phone: Twice a week    Gets together: Twice a week    Attends religious service: More than 4 times per year    Active member of club or organization: No    Attends meetings of clubs or organizations: Never    Relationship status: Divorced  . Intimate partner violence:    Fear of current or ex partner: No    Emotionally abused: No    Physically abused: No    Forced sexual activity: No  Other Topics Concern  . Not on file  Social History Narrative  . Not on file     Current Outpatient Medications:  .  acetaminophen (TYLENOL) 500 MG tablet, Take 1,000 mg by mouth 2 (two) times daily. am, Disp: , Rfl:  .  Multiple Vitamin (MULTIVITAMIN WITH MINERALS) TABS tablet, Take 1 tablet by mouth daily. Reported on 08/30/2015, Disp: , Rfl:   Allergies  Allergen Reactions  . Ceftriaxone Hives  . Meropenem     During hospitalization 2/22- 08/22/2013 patient had persistent fevers while on meropenem. 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 Known hives with ceftriaxone.     ROS  Constitutional: Negative for fever or weight change.  Respiratory: Negative for cough and shortness of breath.   Cardiovascular: Negative for chest pain or palpitations.  Gastrointestinal: Negative for abdominal pain, no bowel changes.  Musculoskeletal: Negative for gait problem or joint swelling.  Skin: Negative for rash.  Neurological: Positive  For intermittent  dizziness but no   headache.  No other specific complaints in a complete  review of systems (except as listed in HPI above).  Objective  Vitals:   05/03/18 0930  BP: 108/62  Pulse: 75  Resp: 16  Temp: 97.6 F (36.4 C)  TempSrc: Oral  SpO2: 99%  Weight: 239 lb 3.2 oz (108.5 kg)  Height: 6' 3.25" (1.911 m)    Body mass index is 29.7 kg/m.  Physical Exam  Constitutional: Patient appears well-developed and well-nourished. No distress.  HENT: Head: Normocephalic and atraumatic. Ears: B TMs ok, no erythema or effusion; Nose: Nose normal. Mouth/Throat: Oropharynx is clear and moist. No oropharyngeal exudate.  Eyes: Conjunctivae and EOM are normal. Pupils are equal, round, and reactive to light. No scleral icterus.  Neck: Normal range of motion. Neck supple. No JVD present. No thyromegaly present.  Cardiovascular: Normal rate, regular rhythm and normal heart sounds.  No murmur heard. No BLE edema. Pulmonary/Chest: Effort normal and breath sounds normal. No respiratory distress. Abdominal: Soft. Bowel sounds are normal, no distension. There is no tenderness. no masses MALE GENITALIA: Normal descended testes bilaterally, no masses palpated, no hernias, no lesions, no discharge RECTAL: Prostate slightly enlarged, but normal consistency, no rectal masses or hemorrhoids  Musculoskeletal: Normal range of motion, no joint effusions. No gross deformities Neurological: he is alert and oriented to person, place, and time. No cranial nerve deficit. Coordination, balance, strength, speech and gait are  normal.  Skin: Skin is warm and dry. No rash noted. No erythema.  Psychiatric: Patient has a normal mood and affect. behavior is normal. Judgment and thought content normal.   PHQ2/9: Depression screen [facility] 2/9 05/03/2018 04/30/2017 04/29/2016 02/26/2016 12/13/2015  Decreased Interest 0 0 0 0 0  Down, Depressed, Hopeless 0 0 0 0 0  PHQ - 2 Score 0 0 0 0 0  Altered sleeping 0 - - - -  Tired, decreased energy 0 - - - -  Change in appetite 0 - - - -  Feeling bad or failure  about yourself  0 - - - -  Trouble concentrating 0 - - - -  Moving slowly or fidgety/restless 0 - - - -  Suicidal thoughts 0 - - - -  PHQ-9 Score 0 - - - -  Difficult doing work/chores Not difficult at all - - - -    Fall Risk: Fall Risk  05/03/2018 04/30/2017 04/29/2016 02/26/2016 12/13/2015  Falls in the past year? 1 Yes No No No  Number falls in past yr: 1 2 or more - - -  Injury with Fall? - No - - -  Risk Factor Category  - High Fall Risk - - -  Comment - Vertigo and Spinal meningitis - - -  Risk for fall due to : History of fall(s) - - - -     Functional Status Survey: Is the patient deaf or have difficulty hearing?: Yes Does the patient have difficulty seeing, even when wearing glasses/contacts?: Yes Does the patient have difficulty concentrating, remembering, or making decisions?: No Does the patient have difficulty walking or climbing stairs?: No Does the patient have difficulty dressing or bathing?: No Does the patient have difficulty doing errands alone such as visiting a doctor's office or shopping?: No   Assessment & Plan  1. Annual physical exam  - COMPLETE METABOLIC PANEL WITH GFR - CBC with Differential/Platelet  2. Screening for prostate cancer  - PSA  3. Need for Tdap vaccination  - Tdap vaccine greater than or equal to 7yo IM  4. Needs flu shot  - Flu Vaccine QUAD 6+ mos PF IM (Fluarix Quad PF)  5. Vitamin D insufficiency  - VITAMIN D 25 Hydroxy (Vit-D Deficiency, Fractures)  6. Hyperglycemia  - Hemoglobin A1c  7. Encounter for screening for HIV  - HIV Antibody (routine testing w rflx)  8. Lipid screening  - Lipid panel  9. Screening for deficiency anemia  - CBC with Differential/Platelet  10. Routine screening for STI (sexually transmitted infection)  - RPR  11. Need for pneumococcal vaccination  - Pneumococcal polysaccharide vaccine 23-valent greater than or equal to 2yo subcutaneous/IM  12. Need for meningococcal  vaccination  - MENINGOCOCCAL MCV4O(MENVEO)   -Prostate cancer screening and PSA options (with potential risks and benefits of testing vs not testing) were discussed along with recent recs/guidelines. -USPSTF grade A and B recommendations reviewed with patient; age-appropriate recommendations, preventive care, screening tests, etc discussed and encouraged; healthy living encouraged; see AVS for patient education given to patient -Discussed importance of 150 minutes of physical activity weekly, eat two servings of fish weekly, eat one serving of tree nuts ( cashews, pistachios, pecans, almonds.Marland Kitchen) every other day, eat 6 servings of fruit/vegetables daily and drink plenty of water and avoid sweet beverages.

## 2018-05-04 LAB — COMPLETE METABOLIC PANEL WITH GFR
AG RATIO: 1.6 (calc) (ref 1.0–2.5)
ALBUMIN MSPROF: 4.1 g/dL (ref 3.6–5.1)
ALT: 15 U/L (ref 9–46)
AST: 20 U/L (ref 10–35)
Alkaline phosphatase (APISO): 64 U/L (ref 40–115)
BILIRUBIN TOTAL: 0.5 mg/dL (ref 0.2–1.2)
BUN: 14 mg/dL (ref 7–25)
CO2: 28 mmol/L (ref 20–32)
Calcium: 9.5 mg/dL (ref 8.6–10.3)
Chloride: 106 mmol/L (ref 98–110)
Creat: 1.26 mg/dL (ref 0.70–1.33)
GFR, EST AFRICAN AMERICAN: 72 mL/min/{1.73_m2} (ref >=60)
GFR, Est Non African American: 62 mL/min/{1.73_m2} (ref >=60)
GLOBULIN: 2.6 g/dL (ref 1.9–3.7)
GLUCOSE: 102 mg/dL — AB (ref 65–99)
POTASSIUM: 4.4 mmol/L (ref 3.5–5.3)
SODIUM: 139 mmol/L (ref 135–146)
TOTAL PROTEIN: 6.7 g/dL (ref 6.1–8.1)

## 2018-05-04 LAB — CBC WITH DIFFERENTIAL/PLATELET
BASOS PCT: 0.7 %
Basophils Absolute: 52 cells/uL (ref 0–200)
EOS ABS: 81 {cells}/uL (ref 15–500)
Eosinophils Relative: 1.1 %
HCT: 46.8 % (ref 38.5–50.0)
HEMOGLOBIN: 16.1 g/dL (ref 13.2–17.1)
LYMPHS ABS: 1968 {cells}/uL (ref 850–3900)
MCH: 33 pg (ref 27.0–33.0)
MCHC: 34.4 g/dL (ref 32.0–36.0)
MCV: 95.9 fL (ref 80.0–100.0)
MONOS PCT: 8.8 %
MPV: 9.1 fL (ref 7.5–12.5)
NEUTROS ABS: 4647 {cells}/uL (ref 1500–7800)
Neutrophils Relative %: 62.8 %
Platelets: 284 10*3/uL (ref 140–400)
RBC: 4.88 10*6/uL (ref 4.20–5.80)
RDW: 12.1 % (ref 11.0–15.0)
Total Lymphocyte: 26.6 %
WBC mixed population: 651 cells/uL (ref 200–950)
WBC: 7.4 10*3/uL (ref 3.8–10.8)

## 2018-05-04 LAB — PSA: PSA: 1.2 ng/mL (ref <=4.0)

## 2018-05-04 LAB — LIPID PANEL
CHOL/HDL RATIO: 2.7 (calc) (ref <5.0)
CHOLESTEROL: 162 mg/dL (ref <200)
HDL: 59 mg/dL (ref >40)
LDL CHOLESTEROL (CALC): 89 mg/dL
Non-HDL Cholesterol (Calc): 103 mg/dL (calc) (ref <130)
TRIGLYCERIDES: 58 mg/dL (ref <150)

## 2018-05-04 LAB — RPR (MONITOR) W/REFL: RPR (Monitor) w/refl Titer: NONREACTIVE

## 2018-05-04 LAB — HEMOGLOBIN A1C
Hgb A1c MFr Bld: 5.4 % of total Hgb (ref <5.7)
Mean Plasma Glucose: 108 (calc)
eAG (mmol/L): 6 (calc)

## 2018-05-04 LAB — VITAMIN D 25 HYDROXY (VIT D DEFICIENCY, FRACTURES): Vit D, 25-Hydroxy: 25 ng/mL — ABNORMAL LOW (ref 30–100)

## 2018-05-04 LAB — HIV ANTIBODY (ROUTINE TESTING W REFLEX): HIV: NONREACTIVE

## 2019-05-06 ENCOUNTER — Encounter: Payer: Medicaid Other | Admitting: Family Medicine

## 2019-05-23 ENCOUNTER — Ambulatory Visit: Payer: Medicaid Other | Admitting: Family Medicine

## 2019-05-23 ENCOUNTER — Other Ambulatory Visit: Payer: Self-pay

## 2019-05-23 ENCOUNTER — Encounter: Payer: Self-pay | Admitting: Family Medicine

## 2019-05-23 VITALS — BP 124/80 | HR 89 | Temp 97.5°F | Resp 18 | Ht 75.0 in | Wt 235.7 lb

## 2019-05-23 DIAGNOSIS — Z23 Encounter for immunization: Secondary | ICD-10-CM

## 2019-05-23 DIAGNOSIS — Z13220 Encounter for screening for lipoid disorders: Secondary | ICD-10-CM | POA: Diagnosis not present

## 2019-05-23 DIAGNOSIS — Z131 Encounter for screening for diabetes mellitus: Secondary | ICD-10-CM

## 2019-05-23 DIAGNOSIS — R7301 Impaired fasting glucose: Secondary | ICD-10-CM | POA: Diagnosis not present

## 2019-05-23 DIAGNOSIS — Z125 Encounter for screening for malignant neoplasm of prostate: Secondary | ICD-10-CM | POA: Diagnosis not present

## 2019-05-23 DIAGNOSIS — Z0000 Encounter for general adult medical examination without abnormal findings: Secondary | ICD-10-CM

## 2019-05-23 NOTE — Patient Instructions (Signed)
Preventive Care 41-60 Years Old, Male Preventive care refers to lifestyle choices and visits with your health care provider that can promote health and wellness. This includes:  A yearly physical exam. This is also called an annual well check.  Regular dental and eye exams.  Immunizations.  Screening for certain conditions.  Healthy lifestyle choices, such as eating a healthy diet, getting regular exercise, not using drugs or products that contain nicotine and tobacco, and limiting alcohol use. What can I expect for my preventive care visit? Physical exam Your health care provider will check:  Height and weight. These may be used to calculate body mass index (BMI), which is a measurement that tells if you are at a healthy weight.  Heart rate and blood pressure.  Your skin for abnormal spots. Counseling Your health care provider may ask you questions about:  Alcohol, tobacco, and drug use.  Emotional well-being.  Home and relationship well-being.  Sexual activity.  Eating habits.  Work and work Statistician. What immunizations do I need?  Influenza (flu) vaccine  This is recommended every year. Tetanus, diphtheria, and pertussis (Tdap) vaccine  You may need a Td booster every 10 years. Varicella (chickenpox) vaccine  You may need this vaccine if you have not already been vaccinated. Zoster (shingles) vaccine  You may need this after age 64. Measles, mumps, and rubella (MMR) vaccine  You may need at least one dose of MMR if you were born in 1957 or later. You may also need a second dose. Pneumococcal conjugate (PCV13) vaccine  You may need this if you have certain conditions and were not previously vaccinated. Pneumococcal polysaccharide (PPSV23) vaccine  You may need one or two doses if you smoke cigarettes or if you have certain conditions. Meningococcal conjugate (MenACWY) vaccine  You may need this if you have certain conditions. Hepatitis A  vaccine  You may need this if you have certain conditions or if you travel or work in places where you may be exposed to hepatitis A. Hepatitis B vaccine  You may need this if you have certain conditions or if you travel or work in places where you may be exposed to hepatitis B. Haemophilus influenzae type b (Hib) vaccine  You may need this if you have certain risk factors. Human papillomavirus (HPV) vaccine  If recommended by your health care provider, you may need three doses over 6 months. You may receive vaccines as individual doses or as more than one vaccine together in one shot (combination vaccines). Talk with your health care provider about the risks and benefits of combination vaccines. What tests do I need? Blood tests  Lipid and cholesterol levels. These may be checked every 5 years, or more frequently if you are over 60 years old.  Hepatitis C test.  Hepatitis B test. Screening  Lung cancer screening. You may have this screening every year starting at age 43 if you have a 30-pack-year history of smoking and currently smoke or have quit within the past 15 years.  Prostate cancer screening. Recommendations will vary depending on your family history and other risks.  Colorectal cancer screening. All adults should have this screening starting at age 72 and continuing until age 2. Your health care provider may recommend screening at age 14 if you are at increased risk. You will have tests every 1-10 years, depending on your results and the type of screening test.  Diabetes screening. This is done by checking your blood sugar (glucose) after you have not eaten  for a while (fasting). You may have this done every 1-3 years.  Sexually transmitted disease (STD) testing. Follow these instructions at home: Eating and drinking  Eat a diet that includes fresh fruits and vegetables, whole grains, lean protein, and low-fat dairy products.  Take vitamin and mineral supplements as  recommended by your health care provider.  Do not drink alcohol if your health care provider tells you not to drink.  If you drink alcohol: ? Limit how much you have to 0-2 drinks a day. ? Be aware of how much alcohol is in your drink. In the [place] one drink equals one 12 oz bottle of beer (355 mL), one 5 oz glass of wine (148 mL), or one 1 oz glass of hard liquor (44 mL). Lifestyle  Take daily care of your teeth and gums.  Stay active. Exercise for at least 30 minutes on 5 or more days each week.  Do not use any products that contain nicotine or tobacco, such as cigarettes, e-cigarettes, and chewing tobacco. If you need help quitting, ask your health care provider.  If you are sexually active, practice safe sex. Use a condom or other form of protection to prevent STIs (sexually transmitted infections).  Talk with your health care provider about taking a low-dose aspirin every day starting at age 50. What's next?  Go to your health care provider once a year for a well check visit.  Ask your health care provider how often you should have your eyes and teeth checked.  Stay up to date on all vaccines. This information is not intended to replace advice given to you by your health care provider. Make sure you discuss any questions you have with your health care provider. Document Released: 06/15/2015 Document Revised: 05/13/2018 Document Reviewed: 05/13/2018 Elsevier Patient Education  2020 [employer]   

## 2019-05-23 NOTE — Progress Notes (Signed)
Name: Raymond Gibson   MRN: [id number]    DOB: January 12, 1961   Date:05/23/2019       Progress Note  Subjective  Chief Complaint  Chief Complaint  Patient presents with  . Annual Exam    HPI  Patient presents for annual CPE.  USPSTF grade A and B recommendations:  Diet: Eating balanced diet Exercise: Has not been in the gym - just got some dumbbells and is starting back on home routine.  Depression: phq 9 is negative Depression screen [facility] 2/9 05/23/2019 05/03/2018 04/30/2017 04/29/2016 02/26/2016  Decreased Interest 0 0 0 0 0  Down, Depressed, Hopeless 0 0 0 0 0  PHQ - 2 Score 0 0 0 0 0  Altered sleeping 0 0 - - -  Tired, decreased energy 0 0 - - -  Change in appetite 0 0 - - -  Feeling bad or failure about yourself  0 0 - - -  Trouble concentrating 0 0 - - -  Moving slowly or fidgety/restless 0 0 - - -  Suicidal thoughts 0 0 - - -  PHQ-9 Score 0 0 - - -  Difficult doing work/chores Not difficult at all Not difficult at all - - -    Hypertension:  BP Readings from Last 3 Encounters:  05/23/19 124/80  05/03/18 108/62  04/30/17 118/66    Obesity: Wt Readings from Last 3 Encounters:  05/23/19 235 lb 11.2 oz (106.9 kg)  05/03/18 239 lb 3.2 oz (108.5 kg)  04/30/17 243 lb 9.6 oz (110.5 kg)   BMI Readings from Last 3 Encounters:  05/23/19 29.46 kg/m  05/03/18 29.70 kg/m  04/30/17 29.65 kg/m     Lipids:  Lab Results  Component Value Date   CHOL 162 05/03/2018   CHOL 159 04/30/2017   CHOL 166 04/29/2016   Lab Results  Component Value Date   HDL 59 05/03/2018   HDL 53 04/30/2017   HDL 72 04/29/2016   Lab Results  Component Value Date   LDLCALC 89 05/03/2018   LDLCALC 90 04/30/2017   LDLCALC 82 04/29/2016   Lab Results  Component Value Date   TRIG 58 05/03/2018   TRIG 70 04/30/2017   TRIG 58 04/29/2016   Lab Results  Component Value Date   CHOLHDL 2.7 05/03/2018   CHOLHDL 3.0 04/30/2017   CHOLHDL 2.3 04/29/2016   No results found for:  LDLDIRECT Glucose:  Glucose  Date Value Ref Range Status  07/20/2013 114 (H) 65 - 99 mg/dL Final  07/19/2013 125 (H) 65 - 99 mg/dL Final  07/18/2013 116 (H) 65 - 99 mg/dL Final   Glucose, Bld  Date Value Ref Range Status  05/03/2018 102 (H) 65 - 99 mg/dL Final    Comment:    .            Fasting reference interval . For someone without known diabetes, a glucose value between 100 and 125 mg/dL is consistent with prediabetes and should be confirmed with a follow-up test. .   04/30/2017 98 65 - 99 mg/dL Final    Comment:    .            Fasting reference interval .   04/29/2016 107 (H) 65 - 99 mg/dL Final      Office Visit from 05/03/2018 in [facility]  AUDIT-C Score  3    - Drinks on the weekends while watching football only.  Divorced STD testing and prevention (HIV/chl/gon/syphilis): Declines Hep C: Hep  C negative in 2018.  Skin cancer: Discussed monitoring for atypical lesions; no concerning lesions noted today. Colorectal cancer: Denies family or personal history of colorectal cancer, no changes in BM's - no blood in stool, dark and tarry stool, mucus in stool, or constipation/diarrhea. Due for colonoscopy December 2021.  Prostate cancer: No family history Lab Results  Component Value Date   PSA 1.2 05/03/2018   PSA 1.4 04/30/2017   PSA 1.0 04/29/2016    IPSS Questionnaire (AUA-7): Over the past month.   1)  How often have you had a sensation of not emptying your bladder completely after you finish urinating?  0 - Not at all  2)  How often have you had to urinate again less than two hours after you finished urinating? 0 - Not at all  3)  How often have you found you stopped and started again several times when you urinated?  0 - Not at all  4) How difficult have you found it to postpone urination?  0 - Not at all  5) How often have you had a weak urinary stream?  0 - Not at all  6) How often have you had to push or strain to begin  urination?  0 - Not at all  7) How many times did you most typically get up to urinate from the time you went to bed until the time you got up in the morning?  0 - None  Total score:  0-7 mildly symptomatic   8-19 moderately symptomatic   20-35 severely symptomatic   Lung cancer: Current smoker - 1ppd (total pack years - 7 years recently after 8 years of cessation - prior to this smoked about 9 years).  Low Dose CT Chest recommended if Age 38-80 years, 30 pack-year currently smoking OR have quit w/in 15years. Patient does not qualify.   AAA: N/A The USPSTF recommends one-time screening with ultrasonography in men ages 80 to 24 years who have ever smoked ECG:  Denies chest pain, shortness of breath, or palpitations.   Advanced Care Planning: A voluntary discussion about advance care planning including the explanation and discussion of advance directives.  Discussed health care proxy and Living will, and the patient was able to identify a health care proxy as Marney Doctor.  Patient does have a living will at present time. If patient does have living will, I have requested they bring this to the clinic to be scanned in to their chart.  Patient Active Problem List   Diagnosis Date Noted  . Hyperglycemia 11/20/2015  . Vitamin D insufficiency 08/30/2015  . ED (erectile dysfunction) 08/30/2015  . Benign neoplasm of transverse colon   . Benign neoplasm of sigmoid colon   . Snores 01/18/2014  . GERD (gastroesophageal reflux disease) 01/18/2014  . Bilateral sensorineural hearing loss 08/30/2013  . Buzzing in ear 08/30/2013    Past Surgical History:  Procedure Laterality Date  . COLONOSCOPY WITH PROPOFOL N/A 05/22/2015   Procedure: COLONOSCOPY WITH PROPOFOL;  Surgeon: Lucilla Lame, MD;  Location: [facility] ENDOSCOPY;  Service: Endoscopy;  Laterality: N/A;  . ELBOW SURGERY     REMOVAL OF ELBOW SPUR  . FOOT SURGERY  1978  . INNER EAR SURGERY    . KNEE SURGERY  07/2013  . PTOSIS REPAIR Right 09/04/2015    Procedure: ptosis repair right eye with biopsy of conjunctiva;  Surgeon: Karle Starch, MD;  Location: [place];  Service: Ophthalmology;  Laterality: Right;  WITH LMA VERY HARD OF  HEARING/SPEAK SLOWLY SPECIAL EQUIPMENT:   FRONTALIS SLING INSTRUMENTS SELFF ROD  . TONSILLECTOMY      Family History  Problem Relation Age of Onset  . Hypertension Mother   . Stroke Father     Social History   Socioeconomic History  . Marital status: Divorced    Spouse name: Not on file  . Number of children: 0  . Years of education: Not on file  . Highest education level: 12th grade  Occupational History  . Occupation: Disabled  Tobacco Use  . Smoking status: Current Every Day Smoker    Packs/day: 1.00    Years: 20.00    Pack years: 20.00    Types: Cigarettes  . Smokeless tobacco: Never Used  Substance and Sexual Activity  . Alcohol use: Yes    Alcohol/week: 5.0 standard drinks    Types: 5 Cans of beer per week  . Drug use: No  . Sexual activity: Yes    Partners: Female  Other Topics Concern  . Not on file  Social History Narrative  . Not on file   Social Determinants of Health   Financial Resource Strain:   . Difficulty of Paying Living Expenses: Not on file  Food Insecurity:   . Worried About Charity fundraiser in the Last Year: Not on file  . Ran Out of Food in the Last Year: Not on file  Transportation Needs:   . Lack of Transportation (Medical): Not on file  . Lack of Transportation (Non-Medical): Not on file  Physical Activity:   . Days of Exercise per Week: Not on file  . Minutes of Exercise per Session: Not on file  Stress:   . Feeling of Stress : Not on file  Social Connections:   . Frequency of Communication with Friends and Family: Not on file  . Frequency of Social Gatherings with Friends and Family: Not on file  . Attends Religious Services: Not on file  . Active Member of Clubs or Organizations: Not on file  . Attends Archivist Meetings: Not  on file  . Marital Status: Not on file  Intimate Partner Violence:   . Fear of Current or Ex-Partner: Not on file  . Emotionally Abused: Not on file  . Physically Abused: Not on file  . Sexually Abused: Not on file     Current Outpatient Medications:  Marland Kitchen  Multiple Vitamin (MULTIVITAMIN WITH MINERALS) TABS tablet, Take 1 tablet by mouth daily. Reported on 08/30/2015, Disp: , Rfl:  .  acetaminophen (TYLENOL) 500 MG tablet, Take 1,000 mg by mouth 2 (two) times daily. am, Disp: , Rfl:   Allergies  Allergen Reactions  . Ceftriaxone Hives  . Meropenem     During hospitalization 2/22- 08/22/2013 patient had persistent fevers while on meropenem. 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 Known hives with ceftriaxone.     ROS  Constitutional: Negative for fever or weight change.  Respiratory: Negative for cough and shortness of breath.   Cardiovascular: Negative for chest pain or palpitations.  Gastrointestinal: Negative for abdominal pain, no bowel changes.  Musculoskeletal: Negative for gait problem or joint swelling.  Skin: Negative for rash.  Neurological: Negative for dizziness or headache.  No other specific complaints in a complete review of systems (except as listed in HPI above).   Objective  Vitals:   05/23/19 1107  BP: 124/80  Pulse: 89  Resp: 18  Temp: (!) 97.5 F (36.4 C)  TempSrc: Temporal  SpO2: 99%  Weight: 235  lb 11.2 oz (106.9 kg)  Height: 6\' 3"  (1.905 m)    Body mass index is 29.46 kg/m.  Physical Exam  Constitutional: Patient appears well-developed and well-nourished. No distress.  HENT: Head: Normocephalic and atraumatic. Ears: B TMs ok, no erythema or effusion; Nose: Nose normal. Mouth/Throat: Oropharynx is clear and moist. No oropharyngeal exudate.  Eyes: Conjunctivae and EOM are normal. Pupils are equal, round, and reactive to light. No scleral icterus.  Neck: Normal range of motion. Neck supple. No JVD present. No thyromegaly present.   Cardiovascular: Normal rate, regular rhythm and normal heart sounds.  No murmur heard. No BLE edema. Pulmonary/Chest: Effort normal and breath sounds normal. No respiratory distress. Abdominal: Soft. Bowel sounds are normal, no distension. There is no tenderness. no masses MALE GENITALIA: Deferred RECTAL: Deferred Musculoskeletal: Normal range of motion, no joint effusions. No gross deformities Neurological: he is alert and oriented to person, place, and time. No cranial nerve deficit. Coordination, balance, strength, speech and gait are normal.  Skin: Skin is warm and dry. No rash noted. No erythema.  Psychiatric: Patient has a normal mood and affect. behavior is normal. Judgment and thought content normal.  No results found for this or any previous visit (from the past 2160 hour(s)).   PHQ2/9: Depression screen [facility] 2/9 05/23/2019 05/03/2018 04/30/2017 04/29/2016 02/26/2016  Decreased Interest 0 0 0 0 0  Down, Depressed, Hopeless 0 0 0 0 0  PHQ - 2 Score 0 0 0 0 0  Altered sleeping 0 0 - - -  Tired, decreased energy 0 0 - - -  Change in appetite 0 0 - - -  Feeling bad or failure about yourself  0 0 - - -  Trouble concentrating 0 0 - - -  Moving slowly or fidgety/restless 0 0 - - -  Suicidal thoughts 0 0 - - -  PHQ-9 Score 0 0 - - -  Difficult doing work/chores Not difficult at all Not difficult at all - - -    Fall Risk: Fall Risk  05/03/2018 04/30/2017 04/29/2016 02/26/2016 12/13/2015  Falls in the past year? 1 Yes No No No  Number falls in past yr: 1 2 or more - - -  Injury with Fall? - No - - -  Risk Factor Category  - High Fall Risk - - -  Comment - Vertigo and Spinal meningitis - - -  Risk for fall due to : History of fall(s) - - - -    Assessment & Plan  1. Annual physical exam -Prostate cancer screening and PSA options (with potential risks and benefits of testing vs not testing) were discussed along with recent recs/guidelines. -USPSTF grade A and B recommendations  reviewed with patient; age-appropriate recommendations, preventive care, screening tests, etc discussed and encouraged; healthy living encouraged; see AVS for patient education given to patient -Discussed importance of 150 minutes of physical activity weekly, eat two servings of fish weekly, eat one serving of tree nuts ( cashews, pistachios, pecans, almonds.Marland Kitchen) every other day, eat 6 servings of fruit/vegetables daily and drink plenty of water and avoid sweet beverages.  - COMPLETE METABOLIC PANEL WITH GFR - Lipid panel - PSA - Pneumococcal conjugate vaccine 13-valent IM - Flu Vaccine QUAD 6+ mos PF IM (Fluarix Quad PF)  2. Lipid screening  - Lipid panel  3. Diabetes mellitus screening  - COMPLETE METABOLIC PANEL WITH GFR  4. Prostate cancer screening  - PSA  5. Need for vaccination for pneumococcus  - Pneumococcal conjugate  vaccine 13-valent IM  6. Needs flu shot  - Flu Vaccine QUAD 6+ mos PF IM (Fluarix Quad PF)

## 2019-05-24 ENCOUNTER — Other Ambulatory Visit: Payer: Self-pay | Admitting: Family Medicine

## 2019-05-24 DIAGNOSIS — R7301 Impaired fasting glucose: Secondary | ICD-10-CM

## 2019-05-24 LAB — HEMOGLOBIN A1C

## 2019-05-24 LAB — COMPLETE METABOLIC PANEL WITH GFR
AG Ratio: 1.6 (calc) (ref 1.0–2.5)
ALT: 13 U/L (ref 9–46)
AST: 14 U/L (ref 10–35)
Albumin: 4.2 g/dL (ref 3.6–5.1)
Alkaline phosphatase (APISO): 57 U/L (ref 35–144)
BUN: 15 mg/dL (ref 7–25)
CO2: 27 mmol/L (ref 20–32)
Calcium: 9.7 mg/dL (ref 8.6–10.3)
Chloride: 108 mmol/L (ref 98–110)
Creat: 1.13 mg/dL (ref 0.70–1.25)
GFR, Est African American: 81 mL/min/{1.73_m2} (ref >=60)
GFR, Est Non African American: 70 mL/min/{1.73_m2} (ref >=60)
Globulin: 2.6 g/dL (calc) (ref 1.9–3.7)
Glucose, Bld: 113 mg/dL — ABNORMAL HIGH (ref 65–99)
Potassium: 4.3 mmol/L (ref 3.5–5.3)
Sodium: 142 mmol/L (ref 135–146)
Total Bilirubin: 0.6 mg/dL (ref 0.2–1.2)
Total Protein: 6.8 g/dL (ref 6.1–8.1)

## 2019-05-24 LAB — TEST AUTHORIZATION

## 2019-05-24 LAB — PSA: PSA: 1.5 ng/mL (ref <=4.0)

## 2019-05-24 LAB — LIPID PANEL
Cholesterol: 169 mg/dL (ref <200)
HDL: 55 mg/dL (ref >=40)
LDL Cholesterol (Calc): 97 mg/dL (calc)
Non-HDL Cholesterol (Calc): 114 mg/dL (calc) (ref <130)
Total CHOL/HDL Ratio: 3.1 (calc) (ref <5.0)
Triglycerides: 77 mg/dL (ref <150)

## 2019-05-25 ENCOUNTER — Ambulatory Visit (INDEPENDENT_AMBULATORY_CARE_PROVIDER_SITE_OTHER): Payer: Medicaid Other | Admitting: Emergency Medicine

## 2019-05-25 ENCOUNTER — Other Ambulatory Visit: Payer: Self-pay

## 2019-05-25 DIAGNOSIS — R7309 Other abnormal glucose: Secondary | ICD-10-CM | POA: Diagnosis not present

## 2019-05-25 LAB — POCT GLYCOSYLATED HEMOGLOBIN (HGB A1C): Hemoglobin A1C: 5.5 % (ref 4.0–5.6)

## 2019-08-15 ENCOUNTER — Telehealth: Payer: Self-pay | Admitting: Family Medicine

## 2019-08-15 NOTE — Telephone Encounter (Signed)
Let pt know that we do not accept [facility]

## 2019-08-15 NOTE — Telephone Encounter (Signed)
Patient is calling see if [facility] take [facility]- It assists with hearing Aids. CB- [phone]

## 2019-12-01 DIAGNOSIS — Z419 Encounter for procedure for purposes other than remedying health state, unspecified: Secondary | ICD-10-CM | POA: Diagnosis not present

## 2019-12-27 ENCOUNTER — Telehealth: Payer: Self-pay | Admitting: Family Medicine

## 2019-12-27 ENCOUNTER — Other Ambulatory Visit: Payer: Self-pay | Admitting: Family Medicine

## 2019-12-27 DIAGNOSIS — H9190 Unspecified hearing loss, unspecified ear: Secondary | ICD-10-CM

## 2019-12-27 NOTE — Telephone Encounter (Signed)
Copied from [place] [phone]. Topic: Referral - Request for Referral >> Dec 27, 2019 11:10 AM Keene Breath wrote: Has patient seen PCP for this complaint? no *If NO, is insurance requiring patient see PCP for this issue before PCP can refer them? Referral for which specialty: ENT Preferred provider/office Patient would like an office in [facility] Reason for referral: Patience hearing aid volume is malfunctioning

## 2020-01-01 DIAGNOSIS — Z419 Encounter for procedure for purposes other than remedying health state, unspecified: Secondary | ICD-10-CM | POA: Diagnosis not present

## 2020-01-05 DIAGNOSIS — H90A22 Sensorineural hearing loss, unilateral, left ear, with restricted hearing on the contralateral side: Secondary | ICD-10-CM | POA: Diagnosis not present

## 2020-02-01 DIAGNOSIS — Z419 Encounter for procedure for purposes other than remedying health state, unspecified: Secondary | ICD-10-CM | POA: Diagnosis not present

## 2020-03-02 DIAGNOSIS — Z419 Encounter for procedure for purposes other than remedying health state, unspecified: Secondary | ICD-10-CM | POA: Diagnosis not present

## 2020-04-02 DIAGNOSIS — Z419 Encounter for procedure for purposes other than remedying health state, unspecified: Secondary | ICD-10-CM | POA: Diagnosis not present

## 2020-05-02 DIAGNOSIS — Z419 Encounter for procedure for purposes other than remedying health state, unspecified: Secondary | ICD-10-CM | POA: Diagnosis not present

## 2020-05-22 NOTE — Progress Notes (Signed)
Name: Raymond Gibson   MRN: [id number]    DOB: 06/09/1960   Date:05/23/2020       Progress Note  Subjective  Chief Complaint  Annual Exam   HPI  Patient presents for annual CPE .  IPSS Questionnaire (AUA-7): Over the past month.   1)  How often have you had a sensation of not emptying your bladder completely after you finish urinating?  0 - Not at all  2)  How often have you had to urinate again less than two hours after you finished urinating? 0 - Not at all  3)  How often have you found you stopped and started again several times when you urinated?  0 - Not at all  4) How difficult have you found it to postpone urination?  0 - Not at all  5) How often have you had a weak urinary stream?  0 - Not at all  6) How often have you had to push or strain to begin urination?  0 - Not at all  7) How many times did you most typically get up to urinate from the time you went to bed until the time you got up in the morning?  0 - None  Total score:  0-7 mildly symptomatic   8-19 moderately symptomatic   20-35 severely symptomatic     Diet: continue balanced diet  Exercise: continue regular physical activity   Depression: phq 9 is negative Depression screen [facility] 2/9 05/23/2020 05/23/2019 05/03/2018 04/30/2017 04/29/2016  Decreased Interest 0 0 0 0 0  Down, Depressed, Hopeless 0 0 0 0 0  PHQ - 2 Score 0 0 0 0 0  Altered sleeping 0 0 0 - -  Tired, decreased energy 0 0 0 - -  Change in appetite 0 0 0 - -  Feeling bad or failure about yourself  1 0 0 - -  Trouble concentrating 0 0 0 - -  Moving slowly or fidgety/restless 0 0 0 - -  Suicidal thoughts 0 0 0 - -  PHQ-9 Score 1 0 0 - -  Difficult doing work/chores Not difficult at all Not difficult at all Not difficult at all - -    Hypertension:  BP Readings from Last 3 Encounters:  05/23/20 116/82  05/23/19 124/80  05/03/18 108/62    Obesity: Wt Readings from Last 3 Encounters:  05/23/20 232 lb 6.4 oz (105.4 kg)  05/23/19 235 lb  11.2 oz (106.9 kg)  05/03/18 239 lb 3.2 oz (108.5 kg)   BMI Readings from Last 3 Encounters:  05/23/20 29.84 kg/m  05/23/19 29.46 kg/m  05/03/18 29.70 kg/m     Lipids:  Lab Results  Component Value Date   CHOL 169 05/23/2019   CHOL 162 05/03/2018   CHOL 159 04/30/2017   Lab Results  Component Value Date   HDL 55 05/23/2019   HDL 59 05/03/2018   HDL 53 04/30/2017   Lab Results  Component Value Date   LDLCALC 97 05/23/2019   LDLCALC 89 05/03/2018   LDLCALC 90 04/30/2017   Lab Results  Component Value Date   TRIG 77 05/23/2019   TRIG 58 05/03/2018   TRIG 70 04/30/2017   Lab Results  Component Value Date   CHOLHDL 3.1 05/23/2019   CHOLHDL 2.7 05/03/2018   CHOLHDL 3.0 04/30/2017   No results found for: LDLDIRECT Glucose:  Glucose  Date Value Ref Range Status  07/20/2013 114 (H) 65 - 99 mg/dL Final  07/19/2013 125 (H)  65 - 99 mg/dL Final  07/18/2013 116 (H) 65 - 99 mg/dL Final   Glucose, Bld  Date Value Ref Range Status  05/23/2019 113 (H) 65 - 99 mg/dL Final    Comment:    .            Fasting reference interval . For someone without known diabetes, a glucose value between 100 and 125 mg/dL is consistent with prediabetes and should be confirmed with a follow-up test. .   05/03/2018 102 (H) 65 - 99 mg/dL Final    Comment:    .            Fasting reference interval . For someone without known diabetes, a glucose value between 100 and 125 mg/dL is consistent with prediabetes and should be confirmed with a follow-up test. .   04/30/2017 98 65 - 99 mg/dL Final    Comment:    .            Fasting reference interval .     [place] Office Visit from 05/23/2020 in [facility]  AUDIT-C Score 6       Divorced STD testing and prevention (HIV/chl/gon/syphilis): not interested  Hep C: 04/30/2017  Skin cancer: Discussed monitoring for atypical lesions Colorectal cancer: 05/22/2015 order placed today as well Prostate  cancer:  Lab Results  Component Value Date   PSA 1.5 05/23/2019   PSA 1.2 05/03/2018   PSA 1.4 04/30/2017     Lung cancer:   Low Dose CT Chest recommended if Age 56-80 years, 30 pack-year currently smoking OR have quit w/in 15years. Patient does not qualify.  Discussed importance of quitting, he does not have a 30 pack smoking history AAA: N/AThe USPSTF recommends one-time screening with ultrasonography in men ages 61 to 21 years who have ever smoked ECG:  11/07/2015  Vaccines:  HPV: up to at age 68 , ask insurance if age between 10-45  Shingrix: 64-64 yo and ask insurance if covered when patient above 42 yo Pneumonia: N/A  educated and discussed with patient. Flu: Given at today's visit, educated and discussed with patient.  Advanced Care Planning: A voluntary discussion about advance care planning including the explanation and discussion of advance directives.  Discussed health care proxy and Living will, and the patient was able to identify a health care proxy as Marney Doctor .  Patient does not have a living will at present time.   Patient Active Problem List   Diagnosis Date Noted  . Hyperglycemia 11/20/2015  . Vitamin D insufficiency 08/30/2015  . ED (erectile dysfunction) 08/30/2015  . Benign neoplasm of transverse colon   . Benign neoplasm of sigmoid colon   . Snores 01/18/2014  . GERD (gastroesophageal reflux disease) 01/18/2014  . Bilateral sensorineural hearing loss 08/30/2013  . Buzzing in ear 08/30/2013    Past Surgical History:  Procedure Laterality Date  . COLONOSCOPY WITH PROPOFOL N/A 05/22/2015   Procedure: COLONOSCOPY WITH PROPOFOL;  Surgeon: Lucilla Lame, MD;  Location: [facility] ENDOSCOPY;  Service: Endoscopy;  Laterality: N/A;  . ELBOW SURGERY     REMOVAL OF ELBOW SPUR  . FOOT SURGERY  1978  . INNER EAR SURGERY    . KNEE SURGERY  07/2013  . PTOSIS REPAIR Right 09/04/2015   Procedure: ptosis repair right eye with biopsy of conjunctiva;  Surgeon: Karle Starch,  MD;  Location: [place];  Service: Ophthalmology;  Laterality: Right;  WITH LMA VERY HARD OF HEARING/SPEAK SLOWLY SPECIAL EQUIPMENT:  FRONTALIS SLING INSTRUMENTS SELFF ROD  . TONSILLECTOMY      Family History  Problem Relation Age of Onset  . Hypertension Mother   . Stroke Father     Social History   Socioeconomic History  . Marital status: Divorced    Spouse name: Not on file  . Number of children: 0  . Years of education: Not on file  . Highest education level: 12th grade  Occupational History  . Occupation: Disabled  Tobacco Use  . Smoking status: Current Every Day Smoker    Packs/day: 1.00    Years: 20.00    Pack years: 20.00    Types: Cigarettes  . Smokeless tobacco: Never Used  Vaping Use  . Vaping Use: Never used  Substance and Sexual Activity  . Alcohol use: Yes    Alcohol/week: 5.0 standard drinks    Types: 5 Cans of beer per week  . Drug use: No  . Sexual activity: Yes    Partners: Female  Other Topics Concern  . Not on file  Social History Narrative  . Not on file   Social Determinants of Health   Financial Resource Strain: Low Risk   . Difficulty of Paying Living Expenses: Not hard at all  Food Insecurity: Not on file  Transportation Needs: No Transportation Needs  . Lack of Transportation (Medical): No  . Lack of Transportation (Non-Medical): No  Physical Activity: Sufficiently Active  . Days of Exercise per Week: 4 days  . Minutes of Exercise per Session: 120 min  Stress: No Stress Concern Present  . Feeling of Stress : Not at all  Social Connections: Moderately Isolated  . Frequency of Communication with Friends and Family: More than three times a week  . Frequency of Social Gatherings with Friends and Family: Once a week  . Attends Religious Services: 1 to 4 times per year  . Active Member of Clubs or Organizations: No  . Attends Archivist Meetings: Never  . Marital Status: Divorced  Human resources officer Violence: Not  At Risk  . Fear of Current or Ex-Partner: No  . Emotionally Abused: No  . Physically Abused: No  . Sexually Abused: No     Current Outpatient Medications:  Marland Kitchen  Multiple Vitamin (MULTIVITAMIN WITH MINERALS) TABS tablet, Take 1 tablet by mouth daily. Reported on 08/30/2015, Disp: , Rfl:  .  acetaminophen (TYLENOL) 500 MG tablet, Take 1,000 mg by mouth 2 (two) times daily. am (Patient not taking: Reported on 05/23/2020), Disp: , Rfl:  .  esomeprazole (NEXIUM) 20 MG capsule, Take by mouth. (Patient not taking: Reported on 05/23/2020), Disp: , Rfl:   Allergies  Allergen Reactions  . Ceftriaxone Hives  . Meropenem     During hospitalization 2/22- 08/22/2013 patient had persistent fevers while on meropenem. 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 Known hives with ceftriaxone.     ROS  Constitutional: Negative for fever or weight change.  Respiratory: Negative for cough and shortness of breath.   Cardiovascular: Negative for chest pain or palpitations.  Gastrointestinal: Negative for abdominal pain, no bowel changes.  Musculoskeletal: Negative for gait problem or joint swelling.  Skin: Negative for rash.  Neurological:positive  for dizziness but no  headache.  No other specific complaints in a complete review of systems (except as listed in HPI above).   Objective  Vitals:   05/23/20 1104  BP: 116/82  Pulse: 86  Resp: 18  Temp: 97.7 F (36.5 C)  TempSrc: Oral  SpO2: 99%  Weight: 232 lb 6.4 oz (105.4 kg)  Height: 6\' 2"  (1.88 m)    Body mass index is 29.84 kg/m.  Physical Exam  Constitutional: Patient appears well-developed and well-nourished. No distress.  HENT: Head: Normocephalic and atraumatic. Ears: hearing aid on right side  Mouth/Throat: not done  Eyes: Conjunctivae and EOM are normal. Pupils are equal, round, and reactive to light. No scleral icterus.  Neck: Normal range of motion. Neck supple. No JVD present. No thyromegaly present.  Cardiovascular:  Normal rate, regular rhythm and normal heart sounds.  No murmur heard. No BLE edema. Pulmonary/Chest: Effort normal and breath sounds normal. No respiratory distress. Abdominal: Soft. Bowel sounds are normal, no distension. There is no tenderness. no masses MALE GENITALIA: Normal descended testes bilaterally, no masses palpated, no hernias, no lesions, no discharge RECTAL: Prostate normal size and consistency, no rectal masses or hemorrhoids  Musculoskeletal: Normal range of motion, no joint effusions. No gross deformities Neurological: he is alert and oriented to person, place, and time. No cranial nerve deficit. Coordination, balance, strength, speech and gait are normal.  Skin: Skin is warm and dry. No rash noted. No erythema.  Psychiatric: Patient has a normal mood and affect. behavior is normal. Judgment and thought content normal.   Fall Risk: Fall Risk  05/23/2020 05/03/2018 04/30/2017 04/29/2016 02/26/2016  Falls in the past year? 1 1 Yes No No  Number falls in past yr: 1 1 2  or more - -  Injury with Fall? 0 - No - -  Risk Factor Category  - - High Fall Risk - -  Comment - - Vertigo and Spinal meningitis - -  Risk for fall due to : History of fall(s);Impaired balance/gait;Other (Comment) History of fall(s) - - -     Functional Status Survey: Is the patient deaf or have difficulty hearing?: Yes (wears hearing aids) Does the patient have difficulty seeing, even when wearing glasses/contacts?: Yes Does the patient have difficulty concentrating, remembering, or making decisions?: No Does the patient have difficulty walking or climbing stairs?: Yes (arthritis of knees) Does the patient have difficulty dressing or bathing?: No Does the patient have difficulty doing errands alone such as visiting a doctor's office or shopping?: No    Assessment & Plan  1. Annual physical exam  - CBC with Differential/Platelet - COMPLETE METABOLIC PANEL WITH GFR  2. Lipid screening  - Lipid  panel  3. Colon cancer screening  - Ambulatory referral to Gastroenterology  4. Diabetes mellitus screening  - Hemoglobin A1c  5. Prostate cancer screening  - PSA  6. Personal history of meningitis   7. Cochlear implant status   8. Other specified hearing loss of both ears   9. Need for immunization against influenza  - Flu Vaccine QUAD 36+ mos IM  -Prostate cancer screening and PSA options (with potential risks and benefits of testing vs not testing) were discussed along with recent recs/guidelines. -USPSTF grade A and B recommendations reviewed with patient; age-appropriate recommendations, preventive care, screening tests, etc discussed and encouraged; healthy living encouraged; see AVS for patient education given to patient -Discussed importance of 150 minutes of physical activity weekly, eat two servings of fish weekly, eat one serving of tree nuts ( cashews, pistachios, pecans, almonds.Marland Kitchen) every other day, eat 6 servings of fruit/vegetables daily and drink plenty of water and avoid sweet beverages.

## 2020-05-23 ENCOUNTER — Other Ambulatory Visit: Payer: Self-pay

## 2020-05-23 ENCOUNTER — Ambulatory Visit: Payer: Medicaid Other | Admitting: Family Medicine

## 2020-05-23 ENCOUNTER — Encounter: Payer: Self-pay | Admitting: Family Medicine

## 2020-05-23 VITALS — BP 116/82 | HR 86 | Temp 97.7°F | Resp 18 | Ht 74.0 in | Wt 232.4 lb

## 2020-05-23 DIAGNOSIS — Z1211 Encounter for screening for malignant neoplasm of colon: Secondary | ICD-10-CM | POA: Diagnosis not present

## 2020-05-23 DIAGNOSIS — Z131 Encounter for screening for diabetes mellitus: Secondary | ICD-10-CM | POA: Diagnosis not present

## 2020-05-23 DIAGNOSIS — Z13220 Encounter for screening for lipoid disorders: Secondary | ICD-10-CM | POA: Diagnosis not present

## 2020-05-23 DIAGNOSIS — H918X3 Other specified hearing loss, bilateral: Secondary | ICD-10-CM | POA: Diagnosis not present

## 2020-05-23 DIAGNOSIS — Z0000 Encounter for general adult medical examination without abnormal findings: Secondary | ICD-10-CM | POA: Diagnosis not present

## 2020-05-23 DIAGNOSIS — Z9621 Cochlear implant status: Secondary | ICD-10-CM | POA: Diagnosis not present

## 2020-05-23 DIAGNOSIS — Z125 Encounter for screening for malignant neoplasm of prostate: Secondary | ICD-10-CM | POA: Diagnosis not present

## 2020-05-23 DIAGNOSIS — Z23 Encounter for immunization: Secondary | ICD-10-CM

## 2020-05-23 DIAGNOSIS — Z8661 Personal history of infections of the central nervous system: Secondary | ICD-10-CM | POA: Diagnosis not present

## 2020-05-23 NOTE — Patient Instructions (Signed)
Preventive Care 40-61 Years Old, Male °Preventive care refers to lifestyle choices and visits with your health care provider that can promote health and wellness. This includes: °· A yearly physical exam. This is also called an annual well check. °· Regular dental and eye exams. °· Immunizations. °· Screening for certain conditions. °· Healthy lifestyle choices, such as eating a healthy diet, getting regular exercise, not using drugs or products that contain nicotine and tobacco, and limiting alcohol use. °What can I expect for my preventive care visit? °Physical exam °Your health care provider will check: °· Height and weight. These may be used to calculate body mass index (BMI), which is a measurement that tells if you are at a healthy weight. °· Heart rate and blood pressure. °· Your skin for abnormal spots. °Counseling °Your health care provider may ask you questions about: °· Alcohol, tobacco, and drug use. °· Emotional well-being. °· Home and relationship well-being. °· Sexual activity. °· Eating habits. °· Work and work environment. °What immunizations do I need? ° °Influenza (flu) vaccine °· This is recommended every year. °Tetanus, diphtheria, and pertussis (Tdap) vaccine °· You may need a Td booster every 10 years. °Varicella (chickenpox) vaccine °· You may need this vaccine if you have not already been vaccinated. °Zoster (shingles) vaccine °· You may need this after age 60. °Measles, mumps, and rubella (MMR) vaccine °· You may need at least one dose of MMR if you were born in 1957 or later. You may also need a second dose. °Pneumococcal conjugate (PCV13) vaccine °· You may need this if you have certain conditions and were not previously vaccinated. °Pneumococcal polysaccharide (PPSV23) vaccine °· You may need one or two doses if you smoke cigarettes or if you have certain conditions. °Meningococcal conjugate (MenACWY) vaccine °· You may need this if you have certain conditions. °Hepatitis A  vaccine °· You may need this if you have certain conditions or if you travel or work in places where you may be exposed to hepatitis A. °Hepatitis B vaccine °· You may need this if you have certain conditions or if you travel or work in places where you may be exposed to hepatitis B. °Haemophilus influenzae type b (Hib) vaccine °· You may need this if you have certain risk factors. °Human papillomavirus (HPV) vaccine °· If recommended by your health care provider, you may need three doses over 6 months. °You may receive vaccines as individual doses or as more than one vaccine together in one shot (combination vaccines). Talk with your health care provider about the risks and benefits of combination vaccines. °What tests do I need? °Blood tests °· Lipid and cholesterol levels. These may be checked every 5 years, or more frequently if you are over 50 years old. °· Hepatitis C test. °· Hepatitis B test. °Screening °· Lung cancer screening. You may have this screening every year starting at age 55 if you have a 30-pack-year history of smoking and currently smoke or have quit within the past 15 years. °· Prostate cancer screening. Recommendations will vary depending on your family history and other risks. °· Colorectal cancer screening. All adults should have this screening starting at age 50 and continuing until age 75. Your health care provider may recommend screening at age 45 if you are at increased risk. You will have tests every 1-10 years, depending on your results and the type of screening test. °· Diabetes screening. This is done by checking your blood sugar (glucose) after you have not eaten   for a while (fasting). You may have this done every 1-3 years.  Sexually transmitted disease (STD) testing. Follow these instructions at home: Eating and drinking  Eat a diet that includes fresh fruits and vegetables, whole grains, lean protein, and low-fat dairy products.  Take vitamin and mineral supplements as  recommended by your health care provider.  Do not drink alcohol if your health care provider tells you not to drink.  If you drink alcohol: ? Limit how much you have to 0-2 drinks a day. ? Be aware of how much alcohol is in your drink. In the [place] one drink equals one 12 oz bottle of beer (355 mL), one 5 oz glass of wine (148 mL), or one 1 oz glass of hard liquor (44 mL). Lifestyle  Take daily care of your teeth and gums.  Stay active. Exercise for at least 30 minutes on 5 or more days each week.  Do not use any products that contain nicotine or tobacco, such as cigarettes, e-cigarettes, and chewing tobacco. If you need help quitting, ask your health care provider.  If you are sexually active, practice safe sex. Use a condom or other form of protection to prevent STIs (sexually transmitted infections).  Talk with your health care provider about taking a low-dose aspirin every day starting at age 50. What's next?  Go to your health care provider once a year for a well check visit.  Ask your health care provider how often you should have your eyes and teeth checked.  Stay up to date on all vaccines. This information is not intended to replace advice given to you by your health care provider. Make sure you discuss any questions you have with your health care provider. Document Revised: 05/13/2018 Document Reviewed: 05/13/2018 Elsevier Patient Education  2020 [employer]  

## 2020-05-24 LAB — CBC WITH DIFFERENTIAL/PLATELET
Absolute Monocytes: 672 cells/uL (ref 200–950)
Basophils Absolute: 49 cells/uL (ref 0–200)
Basophils Relative: 0.6 %
Eosinophils Absolute: 73 cells/uL (ref 15–500)
Eosinophils Relative: 0.9 %
HCT: 46.3 % (ref 38.5–50.0)
Hemoglobin: 16.1 g/dL (ref 13.2–17.1)
Lymphs Abs: 2057 cells/uL (ref 850–3900)
MCH: 33.1 pg — ABNORMAL HIGH (ref 27.0–33.0)
MCHC: 34.8 g/dL (ref 32.0–36.0)
MCV: 95.1 fL (ref 80.0–100.0)
MPV: 9 fL (ref 7.5–12.5)
Monocytes Relative: 8.3 %
Neutro Abs: 5249 cells/uL (ref 1500–7800)
Neutrophils Relative %: 64.8 %
Platelets: 280 10*3/uL (ref 140–400)
RBC: 4.87 10*6/uL (ref 4.20–5.80)
RDW: 12.1 % (ref 11.0–15.0)
Total Lymphocyte: 25.4 %
WBC: 8.1 10*3/uL (ref 3.8–10.8)

## 2020-05-24 LAB — HEMOGLOBIN A1C
Hgb A1c MFr Bld: 5.7 % of total Hgb — ABNORMAL HIGH (ref <5.7)
Mean Plasma Glucose: 117 mg/dL
eAG (mmol/L): 6.5 mmol/L

## 2020-05-24 LAB — LIPID PANEL
Cholesterol: 158 mg/dL (ref <200)
HDL: 64 mg/dL (ref >=40)
LDL Cholesterol (Calc): 80 mg/dL (calc)
Non-HDL Cholesterol (Calc): 94 mg/dL (calc) (ref <130)
Total CHOL/HDL Ratio: 2.5 (calc) (ref <5.0)
Triglycerides: 67 mg/dL (ref <150)

## 2020-05-24 LAB — COMPLETE METABOLIC PANEL WITH GFR
AG Ratio: 1.6 (calc) (ref 1.0–2.5)
ALT: 14 U/L (ref 9–46)
AST: 17 U/L (ref 10–35)
Albumin: 4.2 g/dL (ref 3.6–5.1)
Alkaline phosphatase (APISO): 54 U/L (ref 35–144)
BUN: 15 mg/dL (ref 7–25)
CO2: 23 mmol/L (ref 20–32)
Calcium: 9.6 mg/dL (ref 8.6–10.3)
Chloride: 108 mmol/L (ref 98–110)
Creat: 1.11 mg/dL (ref 0.70–1.25)
GFR, Est African American: 83 mL/min/{1.73_m2} (ref >=60)
GFR, Est Non African American: 71 mL/min/{1.73_m2} (ref >=60)
Globulin: 2.7 g/dL (calc) (ref 1.9–3.7)
Glucose, Bld: 101 mg/dL — ABNORMAL HIGH (ref 65–99)
Potassium: 4.4 mmol/L (ref 3.5–5.3)
Sodium: 142 mmol/L (ref 135–146)
Total Bilirubin: 0.6 mg/dL (ref 0.2–1.2)
Total Protein: 6.9 g/dL (ref 6.1–8.1)

## 2020-05-24 LAB — PSA: PSA: 1.58 ng/mL (ref <=4.0)

## 2020-06-02 DIAGNOSIS — Z419 Encounter for procedure for purposes other than remedying health state, unspecified: Secondary | ICD-10-CM | POA: Diagnosis not present

## 2020-06-04 ENCOUNTER — Telehealth (INDEPENDENT_AMBULATORY_CARE_PROVIDER_SITE_OTHER): Payer: Self-pay | Admitting: Gastroenterology

## 2020-06-04 ENCOUNTER — Other Ambulatory Visit: Payer: Self-pay

## 2020-06-04 DIAGNOSIS — Z86010 Personal history of colonic polyps: Secondary | ICD-10-CM

## 2020-06-04 MED ORDER — NA SULFATE-K SULFATE-MG SULF 17.5-3.13-1.6 GM/177ML PO SOLN: 1.0000 | Freq: Once | ORAL | 0 refills | Status: AC

## 2020-06-04 NOTE — Progress Notes (Signed)
Gastroenterology Pre-Procedure Review  Request Date: Tuesday 06/19/20 Requesting Physician: Dr. Servando Snare  PATIENT REVIEW QUESTIONS: The patient responded to the following health history questions as indicated:    1. Are you having any GI issues? no 2. Do you have a personal history of Polyps? yes (05/22/15 sessile polyps noted on colonoscopy performed by Dr. Servando Snare) 3. Do you have a family history of Colon Cancer or Polyps? no 4. Diabetes Mellitus? no 5. Joint replacements in the past 12 months?no 6. Major health problems in the past 3 months?no 7. Any artificial heart valves, MVP, or defibrillator?no    MEDICATIONS & ALLERGIES:    Patient reports the following regarding taking any anticoagulation/antiplatelet therapy:   Plavix, Coumadin, Eliquis, Xarelto, Lovenox, Pradaxa, Brilinta, or Effient? no Aspirin? no  Patient confirms/reports the following medications:  Current Outpatient Medications  Medication Sig Dispense Refill  . Multiple Vitamin (MULTIVITAMIN WITH MINERALS) TABS tablet Take 1 tablet by mouth daily. Reported on 08/30/2015    . acetaminophen (TYLENOL) 500 MG tablet Take 1,000 mg by mouth 2 (two) times daily. am (Patient not taking: No sig reported)    . esomeprazole (NEXIUM) 20 MG capsule Take by mouth. (Patient not taking: No sig reported)     No current facility-administered medications for this visit.    Patient confirms/reports the following allergies:  Allergies  Allergen Reactions  . Ceftriaxone Hives  . Meropenem     During hospitalization 2/22- 08/22/2013 patient had persistent fevers while on meropenem. 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 Known hives with ceftriaxone.    No orders of the defined types were placed in this encounter.   AUTHORIZATION INFORMATION Primary Insurance: 1D#: Group #:  Secondary Insurance: 1D#: Group #:  SCHEDULE INFORMATION: Date: 06/19/20 Time: Location:[facility]

## 2020-06-15 ENCOUNTER — Other Ambulatory Visit
Admission: RE | Admit: 2020-06-15 | Discharge: 2020-06-15 | Disposition: A | Discharge status: Home / Self Care | Payer: Medicaid Other | Source: Ambulatory Visit | Attending: Gastroenterology | Admitting: Gastroenterology

## 2020-06-15 ENCOUNTER — Other Ambulatory Visit: Payer: Self-pay

## 2020-06-15 DIAGNOSIS — Z20822 Contact with and (suspected) exposure to covid-19: Secondary | ICD-10-CM | POA: Insufficient documentation

## 2020-06-15 DIAGNOSIS — Z01812 Encounter for preprocedural laboratory examination: Secondary | ICD-10-CM | POA: Insufficient documentation

## 2020-06-15 LAB — SARS CORONAVIRUS 2 (TAT 6-24 HRS): SARS Coronavirus 2: NEGATIVE

## 2020-06-18 ENCOUNTER — Encounter: Payer: Self-pay | Admitting: Gastroenterology

## 2020-06-19 ENCOUNTER — Ambulatory Visit: Payer: Medicaid Other | Admitting: Anesthesiology

## 2020-06-19 ENCOUNTER — Other Ambulatory Visit: Payer: Self-pay

## 2020-06-19 ENCOUNTER — Encounter
Admission: RE | Disposition: A | Discharge status: Home / Self Care | Payer: Self-pay | Source: Home / Self Care | Attending: Gastroenterology

## 2020-06-19 ENCOUNTER — Encounter: Payer: Self-pay | Admitting: Gastroenterology

## 2020-06-19 ENCOUNTER — Ambulatory Visit
Admission: RE | Admit: 2020-06-19 | Discharge: 2020-06-19 | Disposition: A | Discharge status: Home / Self Care | Payer: Medicaid Other | Attending: Gastroenterology | Admitting: Gastroenterology

## 2020-06-19 DIAGNOSIS — F17210 Nicotine dependence, cigarettes, uncomplicated: Secondary | ICD-10-CM | POA: Insufficient documentation

## 2020-06-19 DIAGNOSIS — Z85038 Personal history of other malignant neoplasm of large intestine: Secondary | ICD-10-CM | POA: Diagnosis not present

## 2020-06-19 DIAGNOSIS — K5730 Diverticulosis of large intestine without perforation or abscess without bleeding: Secondary | ICD-10-CM | POA: Insufficient documentation

## 2020-06-19 DIAGNOSIS — E559 Vitamin D deficiency, unspecified: Secondary | ICD-10-CM | POA: Diagnosis not present

## 2020-06-19 DIAGNOSIS — K219 Gastro-esophageal reflux disease without esophagitis: Secondary | ICD-10-CM | POA: Diagnosis not present

## 2020-06-19 DIAGNOSIS — Z1211 Encounter for screening for malignant neoplasm of colon: Secondary | ICD-10-CM | POA: Diagnosis not present

## 2020-06-19 DIAGNOSIS — Z860100 Personal history of colon polyps, unspecified: Secondary | ICD-10-CM

## 2020-06-19 DIAGNOSIS — D126 Benign neoplasm of colon, unspecified: Secondary | ICD-10-CM | POA: Diagnosis not present

## 2020-06-19 DIAGNOSIS — K635 Polyp of colon: Secondary | ICD-10-CM

## 2020-06-19 DIAGNOSIS — R739 Hyperglycemia, unspecified: Secondary | ICD-10-CM | POA: Diagnosis not present

## 2020-06-19 DIAGNOSIS — J189 Pneumonia, unspecified organism: Secondary | ICD-10-CM | POA: Diagnosis not present

## 2020-06-19 HISTORY — PX: COLONOSCOPY WITH PROPOFOL: SHX5780

## 2020-06-19 SURGERY — COLONOSCOPY WITH PROPOFOL
Anesthesia: General

## 2020-06-19 MED ORDER — PROPOFOL 10 MG/ML IV BOLUS
INTRAVENOUS | Status: DC | PRN
  Administered 2020-06-19: 70 mg via INTRAVENOUS

## 2020-06-19 MED ORDER — PROPOFOL 500 MG/50ML IV EMUL
INTRAVENOUS | Status: DC | PRN
  Administered 2020-06-19: 140 ug/kg/min via INTRAVENOUS

## 2020-06-19 MED ORDER — SODIUM CHLORIDE 0.9 % IV SOLN: INTRAVENOUS | Status: DC

## 2020-06-19 NOTE — H&P (Signed)
Raymond Lame, MD [facility] [place] [address] [place], [place] [address] Phone:[phone] Fax : [phone]  Primary Care Physician:  Steele Sizer, MD Primary Gastroenterologist:  Dr. Allen Norris  Pre-Procedure History & Physical: HPI:  LJ MIYAMOTO is a 60 y.o. male is here for an colonoscopy.   Past Medical History:  Diagnosis Date  . Allergy   . Arthritis    septic arthritis right knee  . Chronic cough    due to allergies  . Complication of anesthesia    During ear pt stated that when anesthesia went into small vein in hand,her arm felt like it was on fire.  . Full dentures   . GERD (gastroesophageal reflux disease)   . 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 Inner ear hearing loss    vertigo, dizziness  . Meningitis spinal 2015   In Coma for 2 weeks, in Hospital for 2 months.  . Pneumonia    HX OF  . Swelling    of legs and feet    Past Surgical History:  Procedure Laterality Date  . COLONOSCOPY WITH PROPOFOL N/A 05/22/2015   Procedure: COLONOSCOPY WITH PROPOFOL;  Surgeon: Raymond Lame, MD;  Location: [facility] ENDOSCOPY;  Service: Endoscopy;  Laterality: N/A;  . ELBOW SURGERY     REMOVAL OF ELBOW SPUR  . FOOT SURGERY  1978  . INNER EAR SURGERY    . KNEE SURGERY  07/2013  . PTOSIS REPAIR Right 09/04/2015   Procedure: ptosis repair right eye with biopsy of conjunctiva;  Surgeon: Karle Starch, MD;  Location: [place];  Service: Ophthalmology;  Laterality: Right;  WITH LMA VERY HARD OF HEARING/SPEAK SLOWLY SPECIAL EQUIPMENT:   FRONTALIS SLING INSTRUMENTS SELFF ROD  . TONSILLECTOMY      Prior to Admission medications   Medication Sig Start Date End Date Taking? Authorizing Provider  Multiple Vitamin (MULTIVITAMIN WITH MINERALS) TABS tablet Take 1 tablet by mouth daily. Reported on 08/30/2015   Yes [provider]  acetaminophen (TYLENOL) 500 MG tablet Take 1,000 mg by mouth 2 (two) times daily. am Patient not taking: No sig reported     [provider]  esomeprazole (NEXIUM) 20 MG capsule Take by mouth. Patient not taking: No sig reported    [provider]    Allergies as of 06/04/2020 - Review Complete 06/04/2020  Allergen Reaction Noted  . Ceftriaxone Hives 03/23/2015  . Meropenem  03/23/2015    Family History  Problem Relation Age of Onset  . Hypertension Mother   . Stroke Father     Social History   Socioeconomic History  . Marital status: Divorced    Spouse name: Not on file  . Number of children: 0  . Years of education: Not on file  . Highest education level: 12th grade  Occupational History  . Occupation: Disabled  Tobacco Use  . Smoking status: Current Every Day Smoker    Packs/day: 1.00    Years: 20.00    Pack years: 20.00    Types: Cigarettes  . Smokeless tobacco: Never Used  Vaping Use  . Vaping Use: Never used  Substance and Sexual Activity  . Alcohol use: Yes    Alcohol/week: 5.0 standard drinks    Types: 5 Cans of beer per week  . Drug use: No  . Sexual activity: Yes    Partners: Female  Other Topics Concern  . Not on file  Social History Narrative  . Not on file  Social Determinants of Health   Financial Resource Strain: Low Risk   . Difficulty of Paying Living Expenses: Not hard at all  Food Insecurity: No Food Insecurity  . Worried About Charity fundraiser in the Last Year: Never true  . Ran Out of Food in the Last Year: Never true  Transportation Needs: No Transportation Needs  . Lack of Transportation (Medical): No  . Lack of Transportation (Non-Medical): No  Physical Activity: Sufficiently Active  . Days of Exercise per Week: 4 days  . Minutes of Exercise per Session: 120 min  Stress: No Stress Concern Present  . Feeling of Stress : Not at all  Social Connections: Moderately Isolated  . Frequency of Communication with Friends and Family: More than three times a week  . Frequency of Social Gatherings with Friends and Family: Once a week  .  Attends Religious Services: 1 to 4 times per year  . Active Member of Clubs or Organizations: No  . Attends Archivist Meetings: Never  . Marital Status: Divorced  Human resources officer Violence: Not At Risk  . Fear of Current or Ex-Partner: No  . Emotionally Abused: No  . Physically Abused: No  . Sexually Abused: No    Review of Systems: See HPI, otherwise negative ROS  Physical Exam: BP (!) 126/97   Pulse 80   Temp (!) 96.2 F (35.7 C) (Temporal)   Resp 18   Ht 6\' 4"  (1.93 m)   Wt 107 kg   SpO2 100%   BMI 28.73 kg/m  General:   Alert,  pleasant and cooperative in NAD Head:  Normocephalic and atraumatic. Neck:  Supple; no masses or thyromegaly. Lungs:  Clear throughout to auscultation.    Heart:  Regular rate and rhythm. Abdomen:  Soft, nontender and nondistended. Normal bowel sounds, without guarding, and without rebound.   Neurologic:  Alert and  oriented x4;  grossly normal neurologically.  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  Questions have been answered.  All parties agreeable.   Raymond Lame, MD  06/19/2020, 9:39 AM

## 2020-06-19 NOTE — Transfer of Care (Signed)
Immediate Anesthesia Transfer of Care Note  Patient: Raymond Gibson  Procedure(s) Performed: COLONOSCOPY WITH PROPOFOL (N/A )  Patient Location: PACU  Anesthesia Type:General  Level of Consciousness: sedated  Airway & Oxygen Therapy: Patient Spontanous Breathing and Patient connected to nasal cannula oxygen  Post-op Assessment: Report given to RN and Post -op Vital signs reviewed and stable  Post vital signs: Reviewed and stable  Last Vitals:  Vitals Value Taken Time  BP 109/77 06/19/20 1101  Temp    Pulse 74 06/19/20 1101  Resp 21 06/19/20 1101  SpO2 96 % 06/19/20 1101  Vitals shown include unvalidated device data.  Last Pain:  Vitals:   06/19/20 0934  TempSrc: Temporal  PainSc: 0-No pain         Complications: No complications documented.

## 2020-06-19 NOTE — Anesthesia Preprocedure Evaluation (Signed)
Anesthesia Evaluation  Patient identified by MRN, date of birth, ID band Patient awake    Reviewed: Allergy & Precautions, NPO status , Patient's Chart, lab work & pertinent test results, reviewed documented beta blocker date and time   History of Anesthesia Complications (+) history of anesthetic complications  Airway Mallampati: II  TM Distance: >3 FB     Dental  (+) Chipped   Pulmonary Current Smoker and Patient abstained from smoking.,           Cardiovascular      Neuro/Psych    GI/Hepatic GERD  Controlled and Medicated,  Endo/Other    Renal/GU      Musculoskeletal   Abdominal   Peds  Hematology   Anesthesia Other Findings Past Medical History: No date: Allergy No date: Arthritis     Comment:  septic arthritis right knee No date: Chronic cough     Comment:  due to allergies No date: Complication of anesthesia     Comment:  During ear pt stated that when anesthesia went into               small vein in hand,her arm felt like it was on fire. No date: Full dentures No date: GERD (gastroesophageal reflux disease) No date: HOH (hard of hearing)     Comment:  WEARS AIDS/ right ear No date: Inner ear hearing loss     Comment:  vertigo, dizziness 2015: Meningitis spinal     Comment:  In Com[place] for 2 weeks, in Hospital for 2 months. No date: Pneumonia     Comment:  HX OF No date: Swelling     Comment:  of legs and feet   Reproductive/Obstetrics                             Anesthesia Physical  Anesthesia Plan  ASA: III  Anesthesia Plan: General   Post-op Pain Management:    Induction: Intravenous  PONV Risk Score and Plan:   Airway Management Planned: Nasal Cannula  Additional Equipment:   Intra-op Plan:   Post-operative Plan:   Informed Consent: I have reviewed the patients History and Physical, chart, labs and discussed the procedure including the risks, benefits  and alternatives for the proposed anesthesia with the patient or authorized representative who has indicated his/her understanding and acceptance.       Plan Discussed with: CRNA  Anesthesia Plan Comments:         Anesthesia Quick Evaluation

## 2020-06-19 NOTE — Op Note (Signed)
[facility] Gastroenterology Patient Name: Raymond Gibson Procedure Date: 06/19/2020 10:39 AM MRN: [id number] Account #: [id number] Date of Birth: [birth year] Admit Type: Outpatient Age: 60 Room: [facility] ENDO ROOM 4 Gender: Male Note Status: Finalized Procedure:             Colonoscopy Indications:           High risk colon cancer surveillance: Personal history                         of colonic polyps Providers:             Lucilla Lame MD, MD Referring MD:          Bethena Roys. Sowles, MD (Referring MD) Medicines:             Propofol per Anesthesia Complications:         No immediate complications. Procedure:             Pre-Anesthesia Assessment:                        - Prior to the procedure, a History and Physical was                         performed, and patient medications and allergies were                         reviewed. The patient's tolerance of previous                         anesthesia was also reviewed. The risks and benefits                         of the procedure and the sedation options and risks                         were discussed with the patient. All questions were                         answered, and informed consent was obtained. Prior                         Anticoagulants: The patient has taken no previous                         anticoagulant or antiplatelet agents. ASA Grade                         Assessment: II - A patient with mild systemic disease.                         After reviewing the risks and benefits, the patient                         was deemed in satisfactory condition to undergo the                         procedure.  After obtaining informed consent, the colonoscope was                         passed under direct vision. Throughout the procedure,                         the patient's blood pressure, pulse, and oxygen                         saturations were monitored continuously. The                          Colonoscope was introduced through the anus and                         advanced to the the cecum, identified by appendiceal                         orifice and ileocecal valve. The colonoscopy was                         performed without difficulty. The patient tolerated                         the procedure well. The quality of the bowel                         preparation was fair. Findings:      The perianal and digital rectal examinations were normal.      A 3 mm polyp was found in the ascending colon. The polyp was sessile.       The polyp was removed with a cold biopsy forceps. Resection and       retrieval were complete.      A 3 mm polyp was found in the sigmoid colon. The polyp was sessile. The       polyp was removed with a cold biopsy forceps. Resection and retrieval       were complete.      A few small-mouthed diverticula were found in the entire colon. Impression:            - Preparation of the colon was fair.                        - One 3 mm polyp in the ascending colon, removed with                         a cold biopsy forceps. Resected and retrieved.                        - One 3 mm polyp in the sigmoid colon, removed with a                         cold biopsy forceps. Resected and retrieved.                        - Diverticulosis in the entire examined colon. Recommendation:        - Discharge patient to home.                        -  Resume previous diet.                        - Continue present medications.                        - Await pathology results.                        - Repeat colonoscopy in 7 years for surveillance. Procedure Code(s):     --- Professional ---                        [id number], Colonoscopy, flexible; with biopsy, single or                         multiple Diagnosis Code(s):     --- Professional ---                        Z86.010, Personal history of colonic polyps                        K63.5, Polyp of colon CPT  copyright 2019 American Medical Association. All rights reserved. The codes documented in this report are preliminary and upon coder review may  be revised to meet current compliance requirements. Lucilla Lame MD, MD 06/19/2020 11:00:43 AM This report has been signed electronically. Number of Addenda: 0 Note Initiated On: 06/19/2020 10:39 AM Scope Withdrawal Time: 0 hours 8 minutes 46 seconds  Total Procedure Duration: 0 hours 14 minutes 22 seconds  Estimated Blood Loss:  Estimated blood loss: none.      [facility]

## 2020-06-19 NOTE — Anesthesia Postprocedure Evaluation (Signed)
Anesthesia Post Note  Patient: Raymond Gibson  Procedure(s) Performed: COLONOSCOPY WITH PROPOFOL (N/A )  Patient location during evaluation: Endoscopy Anesthesia Type: General Level of consciousness: awake and alert Pain management: pain level controlled Vital Signs Assessment: post-procedure vital signs reviewed and stable Respiratory status: spontaneous breathing, nonlabored ventilation, respiratory function stable and patient connected to nasal cannula oxygen Cardiovascular status: blood pressure returned to baseline and stable Postop Assessment: no apparent nausea or vomiting Anesthetic complications: no   No complications documented.   Last Vitals:  Vitals:   06/19/20 1102 06/19/20 1132  BP:  106/85  Pulse:    Resp:    Temp: (!) 36 C   SpO2:      Last Pain:  Vitals:   06/19/20 1132  TempSrc:   PainSc: 0-No pain                 Martha Clan

## 2020-06-20 ENCOUNTER — Encounter: Payer: Self-pay | Admitting: Gastroenterology

## 2020-06-20 LAB — SURGICAL PATHOLOGY

## 2020-07-03 DIAGNOSIS — Z419 Encounter for procedure for purposes other than remedying health state, unspecified: Secondary | ICD-10-CM | POA: Diagnosis not present

## 2020-07-31 DIAGNOSIS — Z419 Encounter for procedure for purposes other than remedying health state, unspecified: Secondary | ICD-10-CM | POA: Diagnosis not present

## 2020-08-23 NOTE — Progress Notes (Signed)
Name: Raymond Gibson   MRN: [id number]    DOB: February 22, 1961   Date:08/24/2020       Progress Note  Subjective  Chief Complaint  Back Spasms  HPI  Acute low back pain: he goes to the gym, he was doing some back exercises on March 17 th, 2022 and hear a crunch on his left lower back, two days later he picked up his dog crate and felt a pull and pain on left lower back that radiated to right side , left buttocks and when he stands up straight shoots down left lateral thigh. He denies bowel or bladder incontinence. He states he had a similar episode about 5 years ago. He took pain medications and muscle relaxer and since no improvement he went to [facility] and was given morphine     Patient Active Problem List   Diagnosis Date Noted  . History of colonic polyps   . Polyp of sigmoid colon   . Hyperglycemia 11/20/2015  . Vitamin D insufficiency 08/30/2015  . ED (erectile dysfunction) 08/30/2015  . Benign neoplasm of transverse colon   . Benign neoplasm of sigmoid colon   . Snores 01/18/2014  . GERD (gastroesophageal reflux disease) 01/18/2014  . Bilateral sensorineural hearing loss 08/30/2013  . Buzzing in ear 08/30/2013    Past Surgical History:  Procedure Laterality Date  . COLONOSCOPY WITH PROPOFOL N/A 05/22/2015   Procedure: COLONOSCOPY WITH PROPOFOL;  Surgeon: Lucilla Lame, MD;  Location: [facility] ENDOSCOPY;  Service: Endoscopy;  Laterality: N/A;  . COLONOSCOPY WITH PROPOFOL N/A 06/19/2020   Procedure: COLONOSCOPY WITH PROPOFOL;  Surgeon: Lucilla Lame, MD;  Location: [facility] ENDOSCOPY;  Service: Endoscopy;  Laterality: N/A;  . ELBOW SURGERY     REMOVAL OF ELBOW SPUR  . FOOT SURGERY  1978  . INNER EAR SURGERY    . KNEE SURGERY  07/2013  . PTOSIS REPAIR Right 09/04/2015   Procedure: ptosis repair right eye with biopsy of conjunctiva;  Surgeon: Karle Starch, MD;  Location: [facility];  Service: Ophthalmology;  Laterality: Right;  WITH LMA VERY HARD OF HEARING/SPEAK SLOWLY SPECIAL  EQUIPMENT:   FRONTALIS SLING INSTRUMENTS SELFF ROD  . TONSILLECTOMY      Family History  Problem Relation Age of Onset  . Hypertension Mother   . Stroke Father     Social History   Tobacco Use  . Smoking status: Current Every Day Smoker    Packs/day: 1.00    Years: 20.00    Pack years: 20.00    Types: Cigarettes  . Smokeless tobacco: Never Used  Substance Use Topics  . Alcohol use: Yes    Alcohol/week: 5.0 standard drinks    Types: 5 Cans of beer per week     Current Outpatient Medications:  .  acetaminophen (TYLENOL) 500 MG tablet, Take 1,000 mg by mouth 2 (two) times daily. am (Patient not taking: No sig reported), Disp: , Rfl:  .  esomeprazole (NEXIUM) 20 MG capsule, Take by mouth. (Patient not taking: No sig reported), Disp: , Rfl:  .  Multiple Vitamin (MULTIVITAMIN WITH MINERALS) TABS tablet, Take 1 tablet by mouth daily. Reported on 08/30/2015 (Patient not taking: Reported on 08/24/2020), Disp: , Rfl:   Allergies  Allergen Reactions  . Ceftriaxone Hives  . Meropenem     During hospitalization 2/22- 08/22/2013 patient had persistent fevers while on meropenem. 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 Known hives with ceftriaxone.    I personally reviewed active problem list, medication list, allergies, family  history, social history, health maintenance with the patient/caregiver today.   ROS  Constitutional: Negative for fever or weight change.  Respiratory: Negative for cough and shortness of breath.   Cardiovascular: Negative for chest pain or palpitations.  Gastrointestinal: Negative for abdominal pain, no bowel changes.  Musculoskeletal: Positive  for gait problem but no  joint swelling.  Skin: Negative for rash.  Neurological: Negative for dizziness or headache.  No other specific complaints in a complete review of systems (except as listed in HPI above).  Objective  Vitals:   08/24/20 1019  BP: 138/84  Pulse: 98  Resp: 20  Temp: 97.9 F (36.6  C)  TempSrc: Oral  SpO2: 99%  Weight: 238 lb (108 kg)  Height: 6\' 4"  (1.93 m)    Body mass index is 28.97 kg/m.  Physical Exam  Constitutional: Patient appears well-developed and well-nourished. Overweight.  Positive for  distress.  HEENT: head atraumatic, normocephalic, pupils equal and reactive to light, , neck supple Cardiovascular: Normal rate, regular rhythm and normal heart sounds.  No murmur heard. No BLE edema. Pulmonary/Chest: Effort normal and breath sounds normal. No respiratory distress. Abdominal: Soft.  There is no tenderness. Muscular Skeletal: tender to palpation of lumbar spine, he was bending forward on the table. Difficulty standing up straight. Could not do a good exam due to increase in pain Psychiatric: Patient has a normal mood and affect. behavior is normal. Judgment and thought content normal.   Recent Results (from the past 2160 hour(s))  SARS CORONAVIRUS 2 (TAT 6-24 HRS) Nasopharyngeal Nasopharyngeal Swab     Status: None   Collection Time: 06/15/20 11:03 AM   Specimen: Nasopharyngeal Swab  Result Value Ref Range   SARS Coronavirus 2 NEGATIVE NEGATIVE    Comment: (NOTE) SARS-CoV-2 target nucleic acids are NOT DETECTED.  The SARS-CoV-2 RNA is generally detectable in upper and lower respiratory specimens during the acute phase of infection. Negative results do not preclude SARS-CoV-2 infection, do not rule out co-infections with other pathogens, and should not be used as the sole basis for treatment or other patient management decisions. Negative results must be combined with clinical observations, patient history, and epidemiological information. The expected result is Negative.  Fact Sheet for Patients: SugarRoll.be  Fact Sheet for Healthcare Providers: https://www.woods-mathews.com/  This test is not yet approved or cleared by the [place] FDA and  has been authorized for detection and/or diagnosis  of SARS-CoV-2 by FDA under an Emergency Use Authorization (EUA). This EUA will remain  in effect (meaning this test can be used) for the duration of the COVID-19 declaration under Se ction 564(b)(1) of the Act, 21 U.S.C. section 360bbb-3(b)(1), unless the authorization is terminated or revoked sooner.  Performed at [facility], [place] [address], [place], [place] [address]   Surgical pathology     Status: None   Collection Time: 06/19/20 10:50 AM  Result Value Ref Range   SURGICAL PATHOLOGY      SURGICAL PATHOLOGY CASE: [id number] PATIENT: Ledell Peoples Surgical Pathology Report     Specimen Submitted: A. Colon polyp, ascending; cbx B. Colon polyp, sigmoid; cbx  Clinical History: Personal history of colon polyps, Colon polyps      DIAGNOSIS: A.  COLON POLYP, ASCENDING; COLD BIOPSY: - HYPERPLASTIC POLYP. - NEGATIVE FOR DYSPLASIA AND MALIGNANCY.  B.  COLON POLYP, SIGMOID; COLD BIOPSY: - HYPERPLASTIC POLYP. - NEGATIVE FOR DYSPLASIA AND MALIGNANCY.  GROSS DESCRIPTION: A. Labeled: cbx polyp ascending colon Received: Formalin Collection time: 10:50 AM on 06/19/2020  Placed into formalin time: 10:58 AM on 06/19/2020 Tissue fragment(s): 1 Size: 0.7 x 0.4 x 0.2 cm Description: Tan soft tissue fragment Entirely submitted in 1 cassette.  B. Labeled: cbx polyp sigmoid colon Received: Formalin Collection time: 10:56 AM on 06/19/2020 Placed into formalin time: 10:56 AM on 06/19/2020 Tissue fragment(s): 1 Size: 0.6 x 0.5 x 0.2 cm Description: Tan s oft tissue fragment Entirely submitted in 1 cassette.   Final Diagnosis performed by Quay Burow, MD.   Electronically signed 06/20/2020 9:55:13AM The electronic signature indicates that the named Attending Pathologist has evaluated the specimen Technical component performed at [facility], [address], [place], [place] [address] Lab: [phone] Dir: Rush Farmer, MD, MMM  Professional component performed at [facility],  [facility], [place], [place], [place] [address] Lab: [phone] Dir: Dellia Nims. Rubinas, MD      PHQ2/9: Depression screen [facility] 2/9 08/24/2020 05/23/2020 05/23/2019 05/03/2018 04/30/2017  Decreased Interest 0 0 0 0 0  Down, Depressed, Hopeless 0 0 0 0 0  PHQ - 2 Score 0 0 0 0 0  Altered sleeping - 0 0 0 -  Tired, decreased energy - 0 0 0 -  Change in appetite - 0 0 0 -  Feeling bad or failure about yourself  - 1 0 0 -  Trouble concentrating - 0 0 0 -  Moving slowly or fidgety/restless - 0 0 0 -  Suicidal thoughts - 0 0 0 -  PHQ-9 Score - 1 0 0 -  Difficult doing work/chores - Not difficult at all Not difficult at all Not difficult at all -    phq 9 is negative   Fall Risk: Fall Risk  08/24/2020 05/23/2020 05/03/2018 04/30/2017 04/29/2016  Falls in the past year? 0 1 1 Yes No  Number falls in past yr: 0 1 1 2  or more -  Injury with Fall? - 0 - No -  Risk Factor Category  - - - High Fall Risk -  Comment - - - Vertigo and Spinal meningitis -  Risk for fall due to : - History of fall(s);Impaired balance/gait;Other (Comment) History of fall(s) - -     Functional Status Survey: Is the patient deaf or have difficulty hearing?: Yes Does the patient have difficulty seeing, even when wearing glasses/contacts?: No Does the patient have difficulty concentrating, remembering, or making decisions?: No Does the patient have difficulty walking or climbing stairs?: Yes Does the patient have difficulty dressing or bathing?: Yes Does the patient have difficulty doing errands alone such as visiting a doctor's office or shopping?: Yes    Assessment & Plan  1. Acute low back pain with radicular symptoms, duration less than 6 weeks  - celecoxib (CELEBREX) 200 MG capsule; Take 1 capsule (200 mg total) by mouth daily.  Dispense: 30 capsule; Refill: 0 - HYDROcodone-acetaminophen (NORCO) 10-325 MG tablet; Take 1 tablet by mouth every 6 (six) hours as needed for up to 5  days.  Dispense: 20 tablet; Refill: 0 - tiZANidine (ZANAFLEX) 2 MG tablet; Take 1-2 tablets (2-4 mg total) by mouth every 6 (six) hours as needed for muscle spasms.  Dispense: 60 tablet; Refill: 0

## 2020-08-24 ENCOUNTER — Other Ambulatory Visit: Payer: Self-pay

## 2020-08-24 ENCOUNTER — Encounter: Payer: Self-pay | Admitting: Family Medicine

## 2020-08-24 ENCOUNTER — Ambulatory Visit: Payer: Medicaid Other | Admitting: Family Medicine

## 2020-08-24 VITALS — BP 138/84 | HR 98 | Temp 97.9°F | Resp 20 | Ht 76.0 in | Wt 238.0 lb

## 2020-08-24 DIAGNOSIS — M5410 Radiculopathy, site unspecified: Secondary | ICD-10-CM | POA: Diagnosis not present

## 2020-08-24 MED ORDER — TIZANIDINE HCL 2 MG PO TABS
2.0000 mg | ORAL_TABLET | Freq: Four times a day (QID) | ORAL | 0 refills | Status: DC | PRN
Start: 2020-08-24 — End: 2020-09-06

## 2020-08-24 MED ORDER — CELECOXIB 200 MG PO CAPS: 200.0000 mg | ORAL_CAPSULE | Freq: Every day | ORAL | 0 refills | Status: DC

## 2020-08-24 MED ORDER — HYDROCODONE-ACETAMINOPHEN 10-325 MG PO TABS: 1.0000 | ORAL_TABLET | Freq: Four times a day (QID) | ORAL | 0 refills | Status: AC | PRN

## 2020-08-31 ENCOUNTER — Other Ambulatory Visit: Payer: Self-pay | Admitting: Family Medicine

## 2020-08-31 DIAGNOSIS — Z419 Encounter for procedure for purposes other than remedying health state, unspecified: Secondary | ICD-10-CM | POA: Diagnosis not present

## 2020-08-31 DIAGNOSIS — M5410 Radiculopathy, site unspecified: Secondary | ICD-10-CM

## 2020-08-31 NOTE — Telephone Encounter (Signed)
Copied from [place] [phone]. Topic: Quick Communication - Rx Refill/Question >> Aug 31, 2020  3:13 PM Tessa Lerner A wrote: Medication: HYDROcodone-acetaminophen (NORCO) 10-325 MG tablet   Has the patient contacted their pharmacy? Yes. Patient has contacted their pharmacy and been directed to contact their PCP.   Preferred Pharmacy (with phone number or street name): [id number] [place], [place] - [address]  Phone:  [phone]  Agent: Please be advised that RX refills may take up to 3 business days. We ask that you follow-up with your pharmacy.

## 2020-08-31 NOTE — Telephone Encounter (Signed)
Requested medication (s) are due for refill today: Yes  Requested medication (s) are on the active medication list: Yes  Last refill:  08/24/20  Future visit scheduled: Yes  Notes to clinic:  Unable to refill per protocol, cannot delegate.      Requested Prescriptions  Pending Prescriptions Disp Refills   HYDROcodone-acetaminophen (NORCO) 10-325 MG tablet 20 tablet 0    Sig: Take 1 tablet by mouth every 6 (six) hours as needed for up to 5 days.      Not Delegated - Analgesics:  Opioid Agonist Combinations Failed - 08/31/2020  3:32 PM      Failed - This refill cannot be delegated      Failed - Urine Drug Screen completed in last 360 days      Passed - Valid encounter within last 6 months    Recent Outpatient Visits           1 week ago Acute low back pain with radicular symptoms, duration less than 6 weeks   [facility] Steele Sizer, MD   3 months ago Annual physical exam   [facility] Steele Sizer, MD   1 year ago Annual physical exam   [place], [place], FNP   2 years ago Annual physical exam   [facility] Steele Sizer, MD   3 years ago Annual physical exam   [place], [place], MD       Future Appointments             In 9 months Steele Sizer, MD [facility], [facility]

## 2020-09-03 ENCOUNTER — Telehealth: Payer: Self-pay | Admitting: Family Medicine

## 2020-09-03 DIAGNOSIS — M5410 Radiculopathy, site unspecified: Secondary | ICD-10-CM

## 2020-09-03 NOTE — Telephone Encounter (Signed)
Requested medications are due for refill today.  yes  Requested medications are on the active medications list.  yes  Last refill. 08/24/2020  Future visit scheduled.   yes  Notes to clinic.  Medication not delegated

## 2020-09-06 ENCOUNTER — Other Ambulatory Visit: Payer: Self-pay | Admitting: Family Medicine

## 2020-09-06 ENCOUNTER — Telehealth: Payer: Self-pay | Admitting: *Deleted

## 2020-09-06 ENCOUNTER — Telehealth: Payer: Self-pay

## 2020-09-06 DIAGNOSIS — M5410 Radiculopathy, site unspecified: Secondary | ICD-10-CM

## 2020-09-06 NOTE — Telephone Encounter (Signed)
This was sent to the wrong provider Thanks   Copied from [place] [phone]. Topic: General - Other >> Sep 06, 2020 10:37 AM Pawlus, Brayton Layman A wrote: Reason for CRM: Pt wanted a call back regarding the status of his request for HYDROcodone-acetaminophen [facility]), please advise.

## 2020-09-06 NOTE — Telephone Encounter (Addendum)
Taking muscle relaxer 2 every 4hrs during the day, yes it is helping

## 2020-09-06 NOTE — Telephone Encounter (Signed)
Pt.notified

## 2020-09-06 NOTE — Telephone Encounter (Signed)
Patient called while at his preferred pharmacy, [facility] and asked for Zanaflex to be sent to this pharmacy as he requested.[facility] [place] [facility]. TC to pharmacist, read order from his current medication list as prescribed today. Pharmacist stated he will call other pharmacy now to have that order stopped.

## 2020-09-06 NOTE — Telephone Encounter (Signed)
Pt would like to know why this medication was refused to refill. He states he is having severe muscle spasms in his back. Pt states no way he waited 6 hrs to take 2 pills. Pt [facility] [place] now cause he has no one to bring it to him now.  Pt would like a call back to let him know something.

## 2020-09-06 NOTE — Telephone Encounter (Signed)
Requested medication (s) are due for refill today:   Provider to review  Requested medication (s) are on the active medication list:   Yes  Future visit scheduled:   Yes   Last ordered: 08/24/2020 #60, 0 refills  Non delegated refill   Requested Prescriptions  Pending Prescriptions Disp Refills   tiZANidine (ZANAFLEX) 2 MG tablet [Pharmacy Med Name: TIZANIDINE HCL 2 MG TABLET] 60 tablet 0    Sig: Take 1-2 tablets (2-4 mg total) by mouth every 6 (six) hours as needed for muscle spasms.      Not Delegated - Cardiovascular:  Alpha-2 Agonists - tizanidine Failed - 09/06/2020 10:27 AM      Failed - This refill cannot be delegated      Passed - Valid encounter within last 6 months    Recent Outpatient Visits           1 week ago Acute low back pain with radicular symptoms, duration less than 6 weeks   [facility] Steele Sizer, MD   3 months ago Annual physical exam   [facility] Steele Sizer, MD   1 year ago Annual physical exam   [place], [place], FNP   2 years ago Annual physical exam   [facility] Steele Sizer, MD   3 years ago Annual physical exam   [place], [place], MD       Future Appointments             In 8 months Steele Sizer, MD [facility], [facility]

## 2020-09-30 DIAGNOSIS — Z419 Encounter for procedure for purposes other than remedying health state, unspecified: Secondary | ICD-10-CM | POA: Diagnosis not present

## 2020-10-31 DIAGNOSIS — Z419 Encounter for procedure for purposes other than remedying health state, unspecified: Secondary | ICD-10-CM | POA: Diagnosis not present

## 2020-11-30 DIAGNOSIS — Z419 Encounter for procedure for purposes other than remedying health state, unspecified: Secondary | ICD-10-CM | POA: Diagnosis not present

## 2021-01-31 DIAGNOSIS — Z419 Encounter for procedure for purposes other than remedying health state, unspecified: Secondary | ICD-10-CM | POA: Diagnosis not present

## 2021-03-02 DIAGNOSIS — Z419 Encounter for procedure for purposes other than remedying health state, unspecified: Secondary | ICD-10-CM | POA: Diagnosis not present

## 2021-04-02 DIAGNOSIS — Z419 Encounter for procedure for purposes other than remedying health state, unspecified: Secondary | ICD-10-CM | POA: Diagnosis not present

## 2021-05-02 DIAGNOSIS — Z419 Encounter for procedure for purposes other than remedying health state, unspecified: Secondary | ICD-10-CM | POA: Diagnosis not present

## 2021-05-28 NOTE — Progress Notes (Signed)
Name: Raymond Gibson   MRN: [id number]    DOB: 08-23-1960   Date:05/29/2021       Progress Note  Subjective  Chief Complaint  Annual Exam  HPI  Patient presents for annual CPE .  IPSS Questionnaire (AUA-7): Over the past month   1)  How often have you had a sensation of not emptying your bladder completely after you finish urinating?  0 - Not at all  2)  How often have you had to urinate again less than two hours after you finished urinating? 0 - Not at all  3)  How often have you found you stopped and started again several times when you urinated?  0 - Not at all  4) How difficult have you found it to postpone urination?  0 - Not at all  5) How often have you had a weak urinary stream?  0 - Not at all  6) How often have you had to push or strain to begin urination?  0 - Not at all  7) How many times did you most typically get up to urinate from the time you went to bed until the time you got up in the morning?  1 - 1 time  Total score:  0-7 mildly symptomatic   8-19 moderately symptomatic   20-35 severely symptomatic     Diet: needs to improve Exercise: physically active  Depression: phq 9 is negative Depression screen [facility] 2/9 05/29/2021 08/24/2020 05/23/2020 05/23/2019 05/03/2018  Decreased Interest 0 0 0 0 0  Down, Depressed, Hopeless 0 0 0 0 0  PHQ - 2 Score 0 0 0 0 0  Altered sleeping 0 - 0 0 0  Tired, decreased energy 0 - 0 0 0  Change in appetite 0 - 0 0 0  Feeling bad or failure about yourself  0 - 1 0 0  Trouble concentrating 0 - 0 0 0  Moving slowly or fidgety/restless 0 - 0 0 0  Suicidal thoughts 0 - 0 0 0  PHQ-9 Score 0 - 1 0 0  Difficult doing work/chores - - Not difficult at all Not difficult at all Not difficult at all    Hypertension:  BP Readings from Last 3 Encounters:  05/29/21 128/78  08/24/20 138/84  06/19/20 106/85    Obesity: Wt Readings from Last 3 Encounters:  05/29/21 227 lb (103 kg)  08/24/20 238 lb (108 kg)  06/19/20 236 lb (107  kg)   BMI Readings from Last 3 Encounters:  05/29/21 27.63 kg/m  08/24/20 28.97 kg/m  06/19/20 28.73 kg/m     Lipids:  Lab Results  Component Value Date   CHOL 158 05/23/2020   CHOL 169 05/23/2019   CHOL 162 05/03/2018   Lab Results  Component Value Date   HDL 64 05/23/2020   HDL 55 05/23/2019   HDL 59 05/03/2018   Lab Results  Component Value Date   LDLCALC 80 05/23/2020   LDLCALC 97 05/23/2019   LDLCALC 89 05/03/2018   Lab Results  Component Value Date   TRIG 67 05/23/2020   TRIG 77 05/23/2019   TRIG 58 05/03/2018   Lab Results  Component Value Date   CHOLHDL 2.5 05/23/2020   CHOLHDL 3.1 05/23/2019   CHOLHDL 2.7 05/03/2018   No results found for: LDLDIRECT Glucose:  Glucose  Date Value Ref Range Status  07/20/2013 114 (H) 65 - 99 mg/dL Final  07/19/2013 125 (H) 65 - 99 mg/dL Final  07/18/2013 116 (H) 65 -  99 mg/dL Final   Glucose, Bld  Date Value Ref Range Status  05/23/2020 101 (H) 65 - 99 mg/dL Final    Comment:    .            Fasting reference interval . For someone without known diabetes, a glucose value between 100 and 125 mg/dL is consistent with prediabetes and should be confirmed with a follow-up test. .   05/23/2019 113 (H) 65 - 99 mg/dL Final    Comment:    .            Fasting reference interval . For someone without known diabetes, a glucose value between 100 and 125 mg/dL is consistent with prediabetes and should be confirmed with a follow-up test. .   05/03/2018 102 (H) 65 - 99 mg/dL Final    Comment:    .            Fasting reference interval . For someone without known diabetes, a glucose value between 100 and 125 mg/dL is consistent with prediabetes and should be confirmed with a follow-up test. .     [place] Office Visit from 05/23/2020 in [facility]  AUDIT-C Score 6       Divorced STD testing and prevention (HIV/chl/gon/syphilis): 05/03/18 Hep C: 04/30/17  Skin cancer:  Discussed monitoring for atypical lesions Colorectal cancer: 06/19/20  Prostate cancer:  Lab Results  Component Value Date   PSA 1.58 05/23/2020   PSA 1.5 05/23/2019   PSA 1.2 05/03/2018     Lung cancer: Low Dose CT Chest recommended if Age 29-80 years, 30 pack-year currently smoking OR have quit w/in 15years. Patient does not qualify.   ECG:  11/07/15  Vaccines:   Shingrix: first dose today  Pneumonia: up to date BTD:VVOHY   Advanced Care Planning: A voluntary discussion about advance care planning including the explanation and discussion of advance directives.  Discussed health care proxy and Living will, and the patient was able to identify a health care proxy as ex-wife and friend Marney Doctor .   Patient Active Problem List   Diagnosis Date Noted   History of colonic polyps    Polyp of sigmoid colon    Hyperglycemia 11/20/2015   Vitamin D insufficiency 08/30/2015   ED (erectile dysfunction) 08/30/2015   Benign neoplasm of transverse colon    Benign neoplasm of sigmoid colon    Snores 01/18/2014   GERD (gastroesophageal reflux disease) 01/18/2014   Bilateral sensorineural hearing loss 08/30/2013   Buzzing in ear 08/30/2013    Past Surgical History:  Procedure Laterality Date   COLONOSCOPY WITH PROPOFOL N/A 05/22/2015   Procedure: COLONOSCOPY WITH PROPOFOL;  Surgeon: Lucilla Lame, MD;  Location: [facility] ENDOSCOPY;  Service: Endoscopy;  Laterality: N/A;   COLONOSCOPY WITH PROPOFOL N/A 06/19/2020   Procedure: COLONOSCOPY WITH PROPOFOL;  Surgeon: Lucilla Lame, MD;  Location: [facility] ENDOSCOPY;  Service: Endoscopy;  Laterality: N/A;   ELBOW SURGERY     REMOVAL OF ELBOW SPUR   FOOT SURGERY  1978   INNER EAR SURGERY     KNEE SURGERY  07/2013   PTOSIS REPAIR Right 09/04/2015   Procedure: ptosis repair right eye with biopsy of conjunctiva;  Surgeon: Karle Starch, MD;  Location: [place];  Service: Ophthalmology;  Laterality: Right;  WITH LMA VERY HARD OF HEARING/SPEAK  SLOWLY SPECIAL EQUIPMENT:   FRONTALIS SLING INSTRUMENTS SELFF ROD   TONSILLECTOMY      Family History  Problem Relation Age of Onset  Hypertension Mother    Stroke Father     Social History   Socioeconomic History   Marital status: Divorced    Spouse name: Not on file   Number of children: 0   Years of education: Not on file   Highest education level: 12th grade  Occupational History   Occupation: Disabled  Tobacco Use   Smoking status: Every Day    Packs/day: 1.00    Years: 20.00    Pack years: 20.00    Types: Cigarettes   Smokeless tobacco: Never  Vaping Use   Vaping Use: Never used  Substance and Sexual Activity   Alcohol use: Yes    Alcohol/week: 5.0 standard drinks    Types: 5 Cans of beer per week   Drug use: No   Sexual activity: Yes    Partners: Female  Other Topics Concern   Not on file  Social History Narrative   Not on file   Social Determinants of Health   Financial Resource Strain: Low Risk    Difficulty of Paying Living Expenses: Not very hard  Food Insecurity: No Food Insecurity   Worried About Charity fundraiser in the Last Year: Never true   Ran Out of Food in the Last Year: Never true  Transportation Needs: No Transportation Needs   Lack of Transportation (Medical): No   Lack of Transportation (Non-Medical): No  Physical Activity: Sufficiently Active   Days of Exercise per Week: 4 days   Minutes of Exercise per Session: 60 min  Stress: No Stress Concern Present   Feeling of Stress : Not at all  Social Connections: Moderately Isolated   Frequency of Communication with Friends and Family: More than three times a week   Frequency of Social Gatherings with Friends and Family: More than three times a week   Attends Religious Services: More than 4 times per year   Active Member of [employer] or Organizations: No   Attends Archivist Meetings: Never   Marital Status: Divorced  Human resources officer Violence: Not At Risk   Fear of Current  or Ex-Partner: No   Emotionally Abused: No   Physically Abused: No   Sexually Abused: No     Current Outpatient Medications:    acetaminophen (TYLENOL) 500 MG tablet, Take 1,000 mg by mouth 2 (two) times daily. am, Disp: , Rfl:    celecoxib (CELEBREX) 200 MG capsule, Take 1 capsule (200 mg total) by mouth daily., Disp: 30 capsule, Rfl: 0   esomeprazole (NEXIUM) 20 MG capsule, Take by mouth., Disp: , Rfl:    Multiple Vitamin (MULTIVITAMIN WITH MINERALS) TABS tablet, Take 1 tablet by mouth daily. Reported on 08/30/2015, Disp: , Rfl:    tiZANidine (ZANAFLEX) 4 MG tablet, Take 1 tablet (4 mg total) by mouth every 6 (six) hours as needed for muscle spasms., Disp: 60 tablet, Rfl: 0  Allergies  Allergen Reactions   Ceftriaxone Hives   Meropenem     During hospitalization 2/22- 08/22/2013 patient had persistent fevers while on meropenem. 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 Known hives with ceftriaxone.     ROS  Constitutional: Negative for fever or weight change.  Respiratory: Negative for cough and shortness of breath.   Cardiovascular: Negative for chest pain or palpitations.  Gastrointestinal: Negative for abdominal pain, no bowel changes.  Musculoskeletal: Negative for gait problem or joint swelling.  Skin: Negative for rash.  Neurological: Negative for dizziness or headache.  No other specific complaints in a complete review of  systems (except as listed in HPI above).    Objective  Vitals:   05/29/21 1105  BP: 128/78  Pulse: 82  Resp: 16  Temp: 98.3 F (36.8 C)  SpO2: 98%  Weight: 227 lb (103 kg)  Height: 6\' 4"  (1.93 m)    Body mass index is 27.63 kg/m.  Physical Exam  Constitutional: Patient appears well-developed and well-nourished. Overweight.. No distress.  HENT: Head: Normocephalic and atraumatic. Ears: B TMs ok, no erythema or effusion; Nose: Not done l. Mouth/Throat: not done  Eyes: Conjunctivae and EOM are normal. Pupils are equal, round, and  reactive to light. No scleral icterus.  Neck: Normal range of motion. Neck supple. No JVD present. No thyromegaly present.  Cardiovascular: Normal rate, regular rhythm and normal heart sounds.  No murmur heard. No BLE edema. Pulmonary/Chest: Effort normal and breath sounds normal. No respiratory distress. Abdominal: Soft. Bowel sounds are normal, no distension. There is no tenderness. no masses MALE GENITALIA: Normal descended testes bilaterally, no masses palpated, no hernias, no lesions, no discharge RECTAL: Prostate normal size and consistency, skin tag at 10 pm  Musculoskeletal: Normal range of motion, no joint effusions. No gross deformities Neurological: he is alert and oriented to person, place, and time. No cranial nerve deficit. Coordination, balance, strength, speech and gait are normal.  Skin: Skin is warm and dry. No rash noted. No erythema.  Psychiatric: Patient has a normal mood and affect. behavior is normal. Judgment and thought content normal.   Fall Risk: Fall Risk  05/29/2021 08/24/2020 05/23/2020 05/03/2018 04/30/2017  Falls in the past year? 0 0 1 1 Yes  Number falls in past yr: 0 0 1 1 2  or more  Injury with Fall? 0 - 0 - No  Risk Factor Category  - - - - High Fall Risk  Comment - - - - Vertigo and Spinal meningitis  Risk for fall due to : No Fall Risks - History of fall(s);Impaired balance/gait;Other (Comment) History of fall(s) -  Follow up Falls prevention discussed - - - -      Functional Status Survey: Is the patient deaf or have difficulty hearing?: No Does the patient have difficulty seeing, even when wearing glasses/contacts?: No Does the patient have difficulty concentrating, remembering, or making decisions?: No Does the patient have difficulty walking or climbing stairs?: No Does the patient have difficulty dressing or bathing?: No Does the patient have difficulty doing errands alone such as visiting a doctor's office or shopping?: No    Assessment &  Plan  1. Annual physical exam  - Varicella-zoster vaccine IM - Flu Vaccine QUAD 6+ mos PF IM (Fluarix Quad PF) - Meningococcal MCV4O(Menveo) - Lipid panel - COMPLETE METABOLIC PANEL WITH GFR - CBC with Differential/Platelet - Hemoglobin A1c - PSA  2. Lipid screening  - Lipid panel  3. Prostate cancer screening  - PSA  4. Diabetes mellitus screening  - Hemoglobin A1c  5. Cochlear implant status   6. Needs flu shot  - Flu Vaccine QUAD 6+ mos PF IM (Fluarix Quad PF)  7. Need for shingles vaccine  - Varicella-zoster vaccine IM  8. Need for meningococcal vaccination  - Meningococcal MCV4O(Menveo)  9. Routine screening for STI (sexually transmitted infection)  - RPR - HIV Antibody (routine testing w rflx) - Cytology (oral, anal, urethral) ancillary only    -Prostate cancer screening and PSA options (with potential risks and benefits of testing vs not testing) were discussed along with recent recs/guidelines. -USPSTF grade A and B  recommendations reviewed with patient; age-appropriate recommendations, preventive care, screening tests, etc discussed and encouraged; healthy living encouraged; see AVS for patient education given to patient -Discussed importance of 150 minutes of physical activity weekly, eat two servings of fish weekly, eat one serving of tree nuts ( cashews, pistachios, pecans, almonds.Marland Kitchen) every other day, eat 6 servings of fruit/vegetables daily and drink plenty of water and avoid sweet beverages.

## 2021-05-29 ENCOUNTER — Encounter: Payer: Self-pay | Admitting: Family Medicine

## 2021-05-29 ENCOUNTER — Ambulatory Visit: Payer: Medicaid Other | Admitting: Family Medicine

## 2021-05-29 VITALS — BP 128/78 | HR 82 | Temp 98.3°F | Resp 16 | Ht 76.0 in | Wt 227.0 lb

## 2021-05-29 DIAGNOSIS — Z113 Encounter for screening for infections with a predominantly sexual mode of transmission: Secondary | ICD-10-CM

## 2021-05-29 DIAGNOSIS — Z23 Encounter for immunization: Secondary | ICD-10-CM

## 2021-05-29 DIAGNOSIS — Z0000 Encounter for general adult medical examination without abnormal findings: Secondary | ICD-10-CM

## 2021-05-29 DIAGNOSIS — Z131 Encounter for screening for diabetes mellitus: Secondary | ICD-10-CM | POA: Diagnosis not present

## 2021-05-29 DIAGNOSIS — Z13220 Encounter for screening for lipoid disorders: Secondary | ICD-10-CM

## 2021-05-29 DIAGNOSIS — Z125 Encounter for screening for malignant neoplasm of prostate: Secondary | ICD-10-CM

## 2021-05-29 DIAGNOSIS — Z9621 Cochlear implant status: Secondary | ICD-10-CM | POA: Diagnosis not present

## 2021-05-29 NOTE — Patient Instructions (Signed)

## 2021-05-30 LAB — LIPID PANEL
Cholesterol: 165 mg/dL (ref <200)
HDL: 61 mg/dL (ref >=40)
LDL Cholesterol (Calc): 88 mg/dL (calc)
Non-HDL Cholesterol (Calc): 104 mg/dL (calc) (ref <130)
Total CHOL/HDL Ratio: 2.7 (calc) (ref <5.0)
Triglycerides: 72 mg/dL (ref <150)

## 2021-05-30 LAB — CBC WITH DIFFERENTIAL/PLATELET
Absolute Monocytes: 511 cells/uL (ref 200–950)
Basophils Absolute: 43 cells/uL (ref 0–200)
Basophils Relative: 0.6 %
Eosinophils Absolute: 50 cells/uL (ref 15–500)
Eosinophils Relative: 0.7 %
HCT: 46.1 % (ref 38.5–50.0)
Hemoglobin: 15.8 g/dL (ref 13.2–17.1)
Lymphs Abs: 1995 cells/uL (ref 850–3900)
MCH: 32.3 pg (ref 27.0–33.0)
MCHC: 34.3 g/dL (ref 32.0–36.0)
MCV: 94.3 fL (ref 80.0–100.0)
MPV: 9 fL (ref 7.5–12.5)
Monocytes Relative: 7.2 %
Neutro Abs: 4501 cells/uL (ref 1500–7800)
Neutrophils Relative %: 63.4 %
Platelets: 313 10*3/uL (ref 140–400)
RBC: 4.89 10*6/uL (ref 4.20–5.80)
RDW: 12.3 % (ref 11.0–15.0)
Total Lymphocyte: 28.1 %
WBC: 7.1 10*3/uL (ref 3.8–10.8)

## 2021-05-30 LAB — RPR: RPR Ser Ql: NONREACTIVE

## 2021-05-30 LAB — COMPLETE METABOLIC PANEL WITH GFR
AG Ratio: 1.5 (calc) (ref 1.0–2.5)
ALT: 10 U/L (ref 9–46)
AST: 14 U/L (ref 10–35)
Albumin: 4.2 g/dL (ref 3.6–5.1)
Alkaline phosphatase (APISO): 63 U/L (ref 35–144)
BUN: 15 mg/dL (ref 7–25)
CO2: 26 mmol/L (ref 20–32)
Calcium: 9.5 mg/dL (ref 8.6–10.3)
Chloride: 108 mmol/L (ref 98–110)
Creat: 1.06 mg/dL (ref 0.70–1.35)
Globulin: 2.8 g/dL (calc) (ref 1.9–3.7)
Glucose, Bld: 104 mg/dL — ABNORMAL HIGH (ref 65–99)
Potassium: 4.3 mmol/L (ref 3.5–5.3)
Sodium: 140 mmol/L (ref 135–146)
Total Bilirubin: 0.4 mg/dL (ref 0.2–1.2)
Total Protein: 7 g/dL (ref 6.1–8.1)
eGFR: 79 mL/min/{1.73_m2} (ref >=60)

## 2021-05-30 LAB — HEMOGLOBIN A1C
Hgb A1c MFr Bld: 5.6 % of total Hgb (ref <5.7)
Mean Plasma Glucose: 114 mg/dL
eAG (mmol/L): 6.3 mmol/L

## 2021-05-30 LAB — HIV ANTIBODY (ROUTINE TESTING W REFLEX): HIV 1&2 Ab, 4th Generation: NONREACTIVE

## 2021-05-30 LAB — PSA: PSA: 2.39 ng/mL (ref <=4.00)

## 2021-05-31 ENCOUNTER — Other Ambulatory Visit: Payer: Self-pay | Admitting: Family Medicine

## 2021-05-31 ENCOUNTER — Telehealth: Payer: Self-pay

## 2021-05-31 DIAGNOSIS — E7800 Pure hypercholesterolemia, unspecified: Secondary | ICD-10-CM

## 2021-05-31 MED ORDER — ROSUVASTATIN CALCIUM 10 MG PO TABS: 10.0000 mg | ORAL_TABLET | Freq: Every day | ORAL | 0 refills | Status: DC

## 2021-05-31 NOTE — Telephone Encounter (Signed)
Copied from [place] [phone]. Topic: General - Call Back - No Documentation >> May 31, 2021  4:05 PM Erick Blinks wrote: Reason for CRM: Pt has questions about his cholesterol, wants to know if drinking more water will help mainly.  Best contact: [phone]

## 2021-05-31 NOTE — Telephone Encounter (Signed)
Spoke with Dr. Tobie Poet and reviewed his labs to relay all information. Spoke with patient and answered all questions. He gave verbal understanding.

## 2021-06-02 DIAGNOSIS — Z419 Encounter for procedure for purposes other than remedying health state, unspecified: Secondary | ICD-10-CM | POA: Diagnosis not present

## 2021-07-03 DIAGNOSIS — Z419 Encounter for procedure for purposes other than remedying health state, unspecified: Secondary | ICD-10-CM | POA: Diagnosis not present

## 2021-07-31 DIAGNOSIS — Z419 Encounter for procedure for purposes other than remedying health state, unspecified: Secondary | ICD-10-CM | POA: Diagnosis not present

## 2021-08-31 DIAGNOSIS — Z419 Encounter for procedure for purposes other than remedying health state, unspecified: Secondary | ICD-10-CM | POA: Diagnosis not present

## 2021-09-30 DIAGNOSIS — Z419 Encounter for procedure for purposes other than remedying health state, unspecified: Secondary | ICD-10-CM | POA: Diagnosis not present

## 2021-10-31 DIAGNOSIS — Z419 Encounter for procedure for purposes other than remedying health state, unspecified: Secondary | ICD-10-CM | POA: Diagnosis not present

## 2021-11-30 DIAGNOSIS — Z419 Encounter for procedure for purposes other than remedying health state, unspecified: Secondary | ICD-10-CM | POA: Diagnosis not present

## 2021-12-31 DIAGNOSIS — Z419 Encounter for procedure for purposes other than remedying health state, unspecified: Secondary | ICD-10-CM | POA: Diagnosis not present

## 2022-01-31 DIAGNOSIS — Z419 Encounter for procedure for purposes other than remedying health state, unspecified: Secondary | ICD-10-CM | POA: Diagnosis not present

## 2022-03-02 DIAGNOSIS — Z419 Encounter for procedure for purposes other than remedying health state, unspecified: Secondary | ICD-10-CM | POA: Diagnosis not present

## 2022-04-02 DIAGNOSIS — Z419 Encounter for procedure for purposes other than remedying health state, unspecified: Secondary | ICD-10-CM | POA: Diagnosis not present

## 2022-05-02 DIAGNOSIS — Z419 Encounter for procedure for purposes other than remedying health state, unspecified: Secondary | ICD-10-CM | POA: Diagnosis not present

## 2022-05-29 NOTE — Patient Instructions (Signed)

## 2022-05-29 NOTE — Progress Notes (Signed)
Name: Raymond Gibson   MRN: [id number]    DOB: 15-Jul-1960   Date:05/30/2022       Progress Note  Subjective  Chief Complaint  Annual Exam  HPI  Patient presents for annual CPE.  IPSS Questionnaire (AUA-7): Over the past month.   1)  How often have you had a sensation of not emptying your bladder completely after you finish urinating?  0 - Not at all  2)  How often have you had to urinate again less than two hours after you finished urinating? 0 - Not at all  3)  How often have you found you stopped and started again several times when you urinated?  0 - Not at all  4) How difficult have you found it to postpone urination?  0 - Not at all  5) How often have you had a weak urinary stream?  0 - Not at all  6) How often have you had to push or strain to begin urination?  0 - Not at all  7) How many times did you most typically get up to urinate from the time you went to bed until the time you got up in the morning?  1 - 1 time  Total score:  0-7 mildly symptomatic   8-19 moderately symptomatic   20-35 severely symptomatic     Diet: does not cook , eats out frequently  Exercise: continue regular physical activity  Last Dental Exam: he is due for follow up  Last Eye Exam: dentures   Depression: phq 9 is negative    05/30/2022   10:49 AM 05/29/2021   11:05 AM 08/24/2020   10:26 AM 05/23/2020   11:02 AM 05/23/2019   11:10 AM  Depression screen PHQ 2/9  Decreased Interest 0 0 0 0 0  Down, Depressed, Hopeless 0 0 0 0 0  PHQ - 2 Score 0 0 0 0 0  Altered sleeping 0 0  0 0  Tired, decreased energy 0 0  0 0  Change in appetite 0 0  0 0  Feeling bad or failure about yourself  0 0  1 0  Trouble concentrating 0 0  0 0  Moving slowly or fidgety/restless 0 0  0 0  Suicidal thoughts 0 0  0 0  PHQ-9 Score 0 0  1 0  Difficult doing work/chores    Not difficult at all Not difficult at all    Hypertension:  BP Readings from Last 3 Encounters:  05/30/22 122/76  05/29/21 128/78   08/24/20 138/84    Obesity: Wt Readings from Last 3 Encounters:  05/30/22 232 lb 12.8 oz (105.6 kg)  05/29/21 227 lb (103 kg)  08/24/20 238 lb (108 kg)   BMI Readings from Last 3 Encounters:  05/30/22 27.97 kg/m  05/29/21 27.63 kg/m  08/24/20 28.97 kg/m     Lipids:  Lab Results  Component Value Date   CHOL 165 05/29/2021   CHOL 158 05/23/2020   CHOL 169 05/23/2019   Lab Results  Component Value Date   HDL 61 05/29/2021   HDL 64 05/23/2020   HDL 55 05/23/2019   Lab Results  Component Value Date   LDLCALC 88 05/29/2021   LDLCALC 80 05/23/2020   LDLCALC 97 05/23/2019   Lab Results  Component Value Date   TRIG 72 05/29/2021   TRIG 67 05/23/2020   TRIG 77 05/23/2019   Lab Results  Component Value Date   CHOLHDL 2.7 05/29/2021   CHOLHDL 2.5  05/23/2020   CHOLHDL 3.1 05/23/2019   No results found for: "LDLDIRECT" Glucose:  Glucose  Date Value Ref Range Status  07/20/2013 114 (H) 65 - 99 mg/dL Final  07/19/2013 125 (H) 65 - 99 mg/dL Final  07/18/2013 116 (H) 65 - 99 mg/dL Final   Glucose, Bld  Date Value Ref Range Status  05/29/2021 104 (H) 65 - 99 mg/dL Final    Comment:    .            Fasting reference interval . For someone without known diabetes, a glucose value between 100 and 125 mg/dL is consistent with prediabetes and should be confirmed with a follow-up test. .   05/23/2020 101 (H) 65 - 99 mg/dL Final    Comment:    .            Fasting reference interval . For someone without known diabetes, a glucose value between 100 and 125 mg/dL is consistent with prediabetes and should be confirmed with a follow-up test. .   05/23/2019 113 (H) 65 - 99 mg/dL Final    Comment:    .            Fasting reference interval . For someone without known diabetes, a glucose value between 100 and 125 mg/dL is consistent with prediabetes and should be confirmed with a follow-up test. .     [place] Office Visit from 05/30/2022 in [facility]  AUDIT-C Score 5       Divorced STD testing and prevention (HIV/chl/gon/syphilis): 05/29/21 Sexual history: only one sexual partner in years but wants to be checked for STI Hep C Screening: 04/30/17 Skin cancer: Discussed monitoring for atypical lesions Colorectal cancer: 06/19/20  Prostate cancer:   Lab Results  Component Value Date   PSA 2.39 05/29/2021   PSA 1.58 05/23/2020   PSA 1.5 05/23/2019     Lung cancer:  Low Dose CT Chest recommended if Age 68-80 years, 30 pack-year currently smoking OR have quit w/in 15years. Patient  is  candidate for screening  but refuses it  AAA: The USPSTF recommends one-time screening with ultrasonography in men ages 8 to 31 years who have ever smoked. Patient   no, a candidate for screening  ECG:  11/07/15  Vaccines:   RSV: discussed getting at the local pharmacy  Tdap: up to date Shingrix: 1 of 2, 05/2021, due for second shot  Pneumonia: up to date  Flu: 2022, due COVID-19: N/A  Advanced Care Planning: A voluntary discussion about advance care planning including the explanation and discussion of advance directives.  Discussed health care proxy and Living will, and the patient was able to identify a health care proxy as Marney Doctor  .  Patient does not have a living will and power of attorney of health care   Patient Active Problem List   Diagnosis Date Noted   History of colonic polyps    Polyp of sigmoid colon    Hyperglycemia 11/20/2015   Vitamin D insufficiency 08/30/2015   ED (erectile dysfunction) 08/30/2015   Benign neoplasm of transverse colon    Benign neoplasm of sigmoid colon    Snores 01/18/2014   GERD (gastroesophageal reflux disease) 01/18/2014   Bilateral sensorineural hearing loss 08/30/2013   Buzzing in ear 08/30/2013    Past Surgical History:  Procedure Laterality Date   COLONOSCOPY WITH PROPOFOL N/A 05/22/2015   Procedure: COLONOSCOPY WITH PROPOFOL;  Surgeon: Lucilla Lame, MD;   Location: [facility] ENDOSCOPY;  Service: Endoscopy;  Laterality: N/A;   COLONOSCOPY WITH PROPOFOL N/A 06/19/2020   Procedure: COLONOSCOPY WITH PROPOFOL;  Surgeon: Lucilla Lame, MD;  [facility] ENDOSCOPY;  Service: Endoscopy;  Laterality: N/A;   ELBOW SURGERY     REMOVAL OF ELBOW SPUR   FOOT SURGERY  1978   INNER EAR SURGERY     KNEE SURGERY  07/2013   PTOSIS REPAIR Right 09/04/2015   Procedure: ptosis repair right eye with biopsy of conjunctiva;  Surgeon: Karle Starch, MD;  Location: [facility];  Service: Ophthalmology;  Laterality: Right;  WITH LMA VERY HARD OF HEARING/SPEAK SLOWLY SPECIAL EQUIPMENT:   FRONTALIS SLING INSTRUMENTS SELFF ROD   TONSILLECTOMY      Family History  Problem Relation Age of Onset   Hypertension Mother    Stroke Father     Social History   Socioeconomic History   Marital status: Divorced    Spouse name: Not on file   Number of children: 0   Years of education: Not on file   Highest education level: 12th grade  Occupational History   Occupation: Disabled  Tobacco Use   Smoking status: Every Day    Packs/day: 1.00    Years: 20.00    Total pack years: 20.00    Types: Cigarettes   Smokeless tobacco: Never  Vaping Use   Vaping Use: Never used  Substance and Sexual Activity   Alcohol use: Yes    Alcohol/week: 5.0 standard drinks of alcohol    Types: 5 Cans of beer per week   Drug use: No   Sexual activity: Yes    Partners: Female  Other Topics Concern   Not on file  Social History Narrative   Not on file   Social Determinants of Health   Financial Resource Strain: Low Risk  (05/30/2022)   Overall Financial Resource Strain (CARDIA)    Difficulty of Paying Living Expenses: Not hard at all  Food Insecurity: No Food Insecurity (05/30/2022)   Hunger Vital Sign    Worried About Running Out of Food in the Last Year: Never true    [place] in the Last Year: Never true  Transportation Needs: No Transportation Needs (05/30/2022)    PRAPARE - Hydrologist (Medical): No    Lack of Transportation (Non-Medical): No  Physical Activity: Sufficiently Active (05/30/2022)   Exercise Vital Sign    Days of Exercise per Week: 4 days    Minutes of Exercise per Session: 60 min  Stress: No Stress Concern Present (05/30/2022)   [place]    Feeling of Stress : Not at all  Social Connections: Moderately Isolated (05/30/2022)   Social Connection and Isolation Panel [NHANES]    Frequency of Communication with Friends and Family: More than three times a week    Frequency of Social Gatherings with Friends and Family: More than three times a week    Attends Religious Services: More than 4 times per year    Active Member of [employer] or Organizations: No    Attends Archivist Meetings: Never    Marital Status: Divorced  Human resources officer Violence: Not At Risk (05/30/2022)   Humiliation, Afraid, Rape, and Kick questionnaire    Fear of Current or Ex-Partner: No    Emotionally Abused: No    Physically Abused: No    Sexually Abused: No     Current Outpatient Medications:    acetaminophen (TYLENOL) 500  MG tablet, Take 1,000 mg by mouth 2 (two) times daily. am, Disp: , Rfl:    celecoxib (CELEBREX) 200 MG capsule, Take 1 capsule (200 mg total) by mouth daily., Disp: 30 capsule, Rfl: 0   esomeprazole (NEXIUM) 20 MG capsule, Take by mouth., Disp: , Rfl:    Multiple Vitamin (MULTIVITAMIN WITH MINERALS) TABS tablet, Take 1 tablet by mouth daily. Reported on 08/30/2015, Disp: , Rfl:    rosuvastatin (CRESTOR) 10 MG tablet, Take 1 tablet (10 mg total) by mouth daily., Disp: 90 tablet, Rfl: 0   tiZANidine (ZANAFLEX) 4 MG tablet, Take 1 tablet (4 mg total) by mouth every 6 (six) hours as needed for muscle spasms., Disp: 60 tablet, Rfl: 0  Allergies  Allergen Reactions   Ceftriaxone Hives   Meropenem     During hospitalization 2/22- 08/22/2013  patient had persistent fevers while on meropenem. 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 Known hives with ceftriaxone.     ROS  Constitutional: Negative for fever or weight change.  Respiratory: Negative for cough and shortness of breath.   Cardiovascular: Negative for chest pain or palpitations.  Gastrointestinal: Negative for abdominal pain, no bowel changes.  Musculoskeletal: Negative for gait problem or joint swelling.  Skin: Negative for rash.  Neurological: Negative for dizziness or headache.  No other specific complaints in a complete review of systems (except as listed in HPI above).    Objective  Vitals:   05/30/22 1048  BP: 122/76  Pulse: 96  Resp: 18  Temp: 98.1 F (36.7 C)  TempSrc: Oral  SpO2: 99%  Weight: 232 lb 12.8 oz (105.6 kg)  Height: 6' 4.5" (1.943 m)    Body mass index is 27.97 kg/m.  Physical Exam  Constitutional: Patient appears well-developed and well-nourished. No distress.  HENT: Head: Normocephalic and atraumatic. Ears: B TMs ok, no erythema or effusion, haring aid on right side ; Nose: Nose normal. Mouth/Throat: Oropharynx is clear and moist. No oropharyngeal exudate.  Eyes: Conjunctivae and EOM are normal. Pupils are equal, round, and reactive to light. No scleral icterus.  Neck: Normal range of motion. Neck supple. No JVD present. No thyromegaly present.  Cardiovascular: Normal rate, regular rhythm and normal heart sounds.  No murmur heard. No BLE edema. Pulmonary/Chest: Effort normal and breath sounds normal. No respiratory distress. Abdominal: Soft. Bowel sounds are normal, no distension. There is no tenderness. no masses MALE GENITALIA: Normal descended testes bilaterally, no masses palpated, no hernias, no lesions, no discharge RECTAL: Prostate normal size and consistency, no rectal masses or hemorrhoids Musculoskeletal: Normal range of motion, no joint effusions. No gross deformities Neurological: he is alert and oriented to  person, place, and time. No cranial nerve deficit. Coordination, balance, strength, speech and gait are normal.  Skin: Skin is warm and dry. No rash noted. No erythema.  Psychiatric: Patient has a normal mood and affect. behavior is normal. Judgment and thought content normal.   Fall Risk:    05/30/2022   10:49 AM 05/29/2021   11:05 AM 08/24/2020   10:25 AM 05/23/2020   10:43 AM 05/03/2018    9:38 AM  Fall Risk   Falls in the past year? 0 0 0 1 1  Number falls in past yr:  0 0 1 1  Injury with Fall?  0  0   Risk for fall due to : No Fall Risks No Fall Risks  History of fall(s);Impaired balance/gait;Other (Comment) History of fall(s)  Follow up Falls prevention discussed;Education provided;Falls evaluation completed  Falls prevention discussed        Functional Status Survey: Is the patient deaf or have difficulty hearing?: Yes Does the patient have difficulty seeing, even when wearing glasses/contacts?: No Does the patient have difficulty concentrating, remembering, or making decisions?: No Does the patient have difficulty walking or climbing stairs?: No Does the patient have difficulty dressing or bathing?: No Does the patient have difficulty doing errands alone such as visiting a doctor's office or shopping?: No    Assessment & Plan  1. Well adult exam   2. Pure hypercholesterolemia  - Lipid panel  3. Prostate cancer screening  - PSA  4. Diabetes mellitus screening  - Hemoglobin A1c  5. Cochlear implant status   6. Needs flu shot  - Flu Vaccine QUAD 6+ mos PF IM (Fluarix Quad PF)  7. Long-term use of high-risk medication  - CBC with Differential/Platelet - COMPLETE METABOLIC PANEL WITH GFR  8. Routine screening for STI (sexually transmitted infection)  - RPR - HIV Antibody (routine testing w rflx)  9. Need for shingles vaccine  - Varicella-zoster vaccine IM    -Prostate cancer screening and PSA options (with potential risks and benefits of testing  vs not testing) were discussed along with recent recs/guidelines. -USPSTF grade A and B recommendations reviewed with patient; age-appropriate recommendations, preventive care, screening tests, etc discussed and encouraged; healthy living encouraged; see AVS for patient education given to patient -Discussed importance of 150 minutes of physical activity weekly, eat two servings of fish weekly, eat one serving of tree nuts ( cashews, pistachios, pecans, almonds.Marland Kitchen) every other day, eat 6 servings of fruit/vegetables daily and drink plenty of water and avoid sweet beverages.  -Reviewed Health Maintenance: yes

## 2022-05-30 ENCOUNTER — Encounter: Payer: Self-pay | Admitting: Family Medicine

## 2022-05-30 ENCOUNTER — Ambulatory Visit (INDEPENDENT_AMBULATORY_CARE_PROVIDER_SITE_OTHER): Payer: Medicaid Other | Admitting: Family Medicine

## 2022-05-30 VITALS — BP 122/76 | HR 96 | Temp 98.1°F | Resp 18 | Ht 76.5 in | Wt 232.8 lb

## 2022-05-30 DIAGNOSIS — Z9621 Cochlear implant status: Secondary | ICD-10-CM

## 2022-05-30 DIAGNOSIS — Z0000 Encounter for general adult medical examination without abnormal findings: Secondary | ICD-10-CM | POA: Diagnosis not present

## 2022-05-30 DIAGNOSIS — E7800 Pure hypercholesterolemia, unspecified: Secondary | ICD-10-CM

## 2022-05-30 DIAGNOSIS — Z23 Encounter for immunization: Secondary | ICD-10-CM | POA: Diagnosis not present

## 2022-05-30 DIAGNOSIS — Z79899 Other long term (current) drug therapy: Secondary | ICD-10-CM | POA: Diagnosis not present

## 2022-05-30 DIAGNOSIS — Z131 Encounter for screening for diabetes mellitus: Secondary | ICD-10-CM | POA: Diagnosis not present

## 2022-05-30 DIAGNOSIS — Z125 Encounter for screening for malignant neoplasm of prostate: Secondary | ICD-10-CM

## 2022-05-30 DIAGNOSIS — Z113 Encounter for screening for infections with a predominantly sexual mode of transmission: Secondary | ICD-10-CM

## 2022-06-02 DIAGNOSIS — Z419 Encounter for procedure for purposes other than remedying health state, unspecified: Secondary | ICD-10-CM | POA: Diagnosis not present

## 2022-06-03 ENCOUNTER — Other Ambulatory Visit: Payer: Self-pay | Admitting: Family Medicine

## 2022-06-03 LAB — CBC WITH DIFFERENTIAL/PLATELET
Absolute Monocytes: 640 cells/uL (ref 200–950)
Basophils Absolute: 47 cells/uL (ref 0–200)
Basophils Relative: 0.6 %
Eosinophils Absolute: 62 cells/uL (ref 15–500)
Eosinophils Relative: 0.8 %
HCT: 46.6 % (ref 38.5–50.0)
Hemoglobin: 15.9 g/dL (ref 13.2–17.1)
Lymphs Abs: 1825 cells/uL (ref 850–3900)
MCH: 32.2 pg (ref 27.0–33.0)
MCHC: 34.1 g/dL (ref 32.0–36.0)
MCV: 94.3 fL (ref 80.0–100.0)
MPV: 9 fL (ref 7.5–12.5)
Monocytes Relative: 8.2 %
Neutro Abs: 5226 cells/uL (ref 1500–7800)
Neutrophils Relative %: 67 %
Platelets: 282 10*3/uL (ref 140–400)
RBC: 4.94 10*6/uL (ref 4.20–5.80)
RDW: 12.4 % (ref 11.0–15.0)
Total Lymphocyte: 23.4 %
WBC: 7.8 10*3/uL (ref 3.8–10.8)

## 2022-06-03 LAB — RPR: RPR Ser Ql: NONREACTIVE

## 2022-06-03 LAB — HEMOGLOBIN A1C
Hgb A1c MFr Bld: 5.8 % of total Hgb — ABNORMAL HIGH (ref <5.7)
Mean Plasma Glucose: 120 mg/dL
eAG (mmol/L): 6.6 mmol/L

## 2022-06-03 LAB — COMPLETE METABOLIC PANEL WITH GFR
AG Ratio: 1.4 (calc) (ref 1.0–2.5)
ALT: 15 U/L (ref 9–46)
AST: 18 U/L (ref 10–35)
Albumin: 4.3 g/dL (ref 3.6–5.1)
Alkaline phosphatase (APISO): 56 U/L (ref 35–144)
BUN: 20 mg/dL (ref 7–25)
CO2: 28 mmol/L (ref 20–32)
Calcium: 9.4 mg/dL (ref 8.6–10.3)
Chloride: 106 mmol/L (ref 98–110)
Creat: 1.31 mg/dL (ref 0.70–1.35)
Globulin: 3 g/dL (calc) (ref 1.9–3.7)
Glucose, Bld: 93 mg/dL (ref 65–99)
Potassium: 4.2 mmol/L (ref 3.5–5.3)
Sodium: 142 mmol/L (ref 135–146)
Total Bilirubin: 0.6 mg/dL (ref 0.2–1.2)
Total Protein: 7.3 g/dL (ref 6.1–8.1)
eGFR: 61 mL/min/{1.73_m2} (ref >=60)

## 2022-06-03 LAB — LIPID PANEL
Cholesterol: 166 mg/dL (ref <200)
HDL: 61 mg/dL (ref >=40)
LDL Cholesterol (Calc): 88 mg/dL (calc)
Non-HDL Cholesterol (Calc): 105 mg/dL (calc) (ref <130)
Total CHOL/HDL Ratio: 2.7 (calc) (ref <5.0)
Triglycerides: 79 mg/dL (ref <150)

## 2022-06-03 LAB — PSA: PSA: 1.48 ng/mL (ref <=4.00)

## 2022-06-03 LAB — HIV ANTIBODY (ROUTINE TESTING W REFLEX): HIV 1&2 Ab, 4th Generation: NONREACTIVE

## 2022-06-03 MED ORDER — ROSUVASTATIN CALCIUM 20 MG PO TABS: 20.0000 mg | ORAL_TABLET | Freq: Every day | ORAL | 1 refills | Status: DC

## 2022-06-11 ENCOUNTER — Other Ambulatory Visit: Payer: Self-pay | Admitting: Family Medicine

## 2022-06-11 NOTE — Telephone Encounter (Signed)
Medication Refill - Medication: rosuvastatin (CRESTOR) 20 MG tablet   Has the patient contacted their pharmacy? Yes.   Pt is requesting medication be transferred to pharmacy below.   (Agent: If yes, when and what did the pharmacy advise?)  Preferred Pharmacy (with phone number or street name):  [id number] [id number] Phone: [phone]: [phone] Hours: Not open 24 hours   Has the patient been seen for an appointment in the last year OR does the patient have an upcoming appointment? Yes.    Agent: Please be advised that RX refills may take up to 3 business days. We ask that you follow-up with your pharmacy.

## 2022-06-11 NOTE — Telephone Encounter (Signed)
Unable to refill per protocol, Rx request is too soon. Last refill 06/03/22 for 90 and 1 refill. Will refuse duplicate request.  Requested Prescriptions  Pending Prescriptions Disp Refills   rosuvastatin (CRESTOR) 20 MG tablet 90 tablet 1    Sig: Take 1 tablet (20 mg total) by mouth daily.     Cardiovascular:  Antilipid - Statins 2 Failed - 06/11/2022 12:27 PM      Failed - Lipid Panel in normal range within the last 12 months    Cholesterol, Total  Date Value Ref Range Status  04/25/2015 175 100 - 199 mg/dL Final   Cholesterol  Date Value Ref Range Status  05/30/2022 166 <200 mg/dL Final   LDL Cholesterol (Calc)  Date Value Ref Range Status  05/30/2022 88 mg/dL (calc) Final    Comment:    Reference range: <100 . Desirable range <100 mg/dL for primary prevention;   <70 mg/dL for patients with CHD or diabetic patients  with > or = 2 CHD risk factors. Marland Kitchen LDL-C is now calculated using the Martin-Hopkins  calculation, which is a validated novel method providing  better accuracy than the Friedewald equation in the  estimation of LDL-C.  Cresenciano Genre et al. Annamaria Helling. 3893;734(28): 2061-2068  (http://education.[facility].com/faq/FAQ164)    HDL  Date Value Ref Range Status  05/30/2022 61 > OR = 40 mg/dL Final  04/25/2015 62 >39 mg/dL Final   Triglycerides  Date Value Ref Range Status  05/30/2022 79 <150 mg/dL Final  07/08/2013 256 (H) 0 - 200 mg/dL Final         Passed - Cr in normal range and within 360 days    Creat  Date Value Ref Range Status  05/30/2022 1.31 0.70 - 1.35 mg/dL Final         Passed - Patient is not pregnant      Passed - Valid encounter within last 12 months    Recent Outpatient Visits           1 week ago Well adult exam   [facility] Steele Sizer, MD   1 year ago Annual physical exam   [facility] Steele Sizer, MD   1 year ago Acute low back pain with radicular symptoms, duration less than 6 weeks    [facility] Steele Sizer, MD   2 years ago Annual physical exam   [facility] Steele Sizer, MD   3 years ago Annual physical exam   [place], [place], [place]       Future Appointments             In 6 months Steele Sizer, MD [facility], [facility]

## 2022-07-03 DIAGNOSIS — Z419 Encounter for procedure for purposes other than remedying health state, unspecified: Secondary | ICD-10-CM | POA: Diagnosis not present

## 2022-08-01 DIAGNOSIS — Z419 Encounter for procedure for purposes other than remedying health state, unspecified: Secondary | ICD-10-CM | POA: Diagnosis not present

## 2022-09-01 DIAGNOSIS — Z419 Encounter for procedure for purposes other than remedying health state, unspecified: Secondary | ICD-10-CM | POA: Diagnosis not present

## 2022-10-01 DIAGNOSIS — Z419 Encounter for procedure for purposes other than remedying health state, unspecified: Secondary | ICD-10-CM | POA: Diagnosis not present

## 2022-11-01 DIAGNOSIS — Z419 Encounter for procedure for purposes other than remedying health state, unspecified: Secondary | ICD-10-CM | POA: Diagnosis not present

## 2022-12-01 DIAGNOSIS — Z419 Encounter for procedure for purposes other than remedying health state, unspecified: Secondary | ICD-10-CM | POA: Diagnosis not present

## 2022-12-12 NOTE — Progress Notes (Signed)
Name: Raymond Gibson   MRN: [id number]    DOB: 12/02/1960   Date:12/15/2022       Progress Note  Subjective  Chief Complaint  Follow Up  HPI  Hyperlipidemia: he states he has been taking medication, rosuvastatin and denies side effects. We will recheck level during his CPE this Fall since lab already closed today. He denies chest pain, palpitation or decrease in exercise tolerance   The 10-year ASCVD risk score (Arnett DK, et al., 2019) is: 12.4%   Values used to calculate the score:     Age: 62 years     Sex: Male     Is Non-Hispanic African American: Yes     Diabetic: No     Tobacco smoker: Yes     Systolic Blood Pressure: 120 mmHg     Is BP treated: No     HDL Cholesterol: 61 mg/dL     Total Cholesterol: 166 mg/dL   GERD: symptoms resolved, he is no longer taking PPI  Hearing loss: he was wearing hearing aids but still could not hear well during his visit   Patient Active Problem List   Diagnosis Date Noted   History of colonic polyps    Polyp of sigmoid colon    Hyperglycemia 11/20/2015   Vitamin D insufficiency 08/30/2015   ED (erectile dysfunction) 08/30/2015   Benign neoplasm of transverse colon    Benign neoplasm of sigmoid colon    Snores 01/18/2014   GERD (gastroesophageal reflux disease) 01/18/2014   Bilateral sensorineural hearing loss 08/30/2013   Buzzing in ear 08/30/2013    Past Surgical History:  Procedure Laterality Date   COLONOSCOPY WITH PROPOFOL N/A 05/22/2015   Procedure: COLONOSCOPY WITH PROPOFOL;  Surgeon: Midge Minium, MD;  Location: [facility] ENDOSCOPY;  Service: Endoscopy;  Laterality: N/A;   COLONOSCOPY WITH PROPOFOL N/A 06/19/2020   Procedure: COLONOSCOPY WITH PROPOFOL;  Surgeon: Midge Minium, MD;  Location: [facility] ENDOSCOPY;  Service: Endoscopy;  Laterality: N/A;   ELBOW SURGERY     REMOVAL OF ELBOW SPUR   FOOT SURGERY  1978   INNER EAR SURGERY     KNEE SURGERY  07/2013   PTOSIS REPAIR Right 09/04/2015   Procedure: ptosis repair right eye  with biopsy of conjunctiva;  Surgeon: Imagene Riches, MD;  [facility];  Service: Ophthalmology;  Laterality: Right;  WITH LMA VERY HARD OF HEARING/SPEAK SLOWLY SPECIAL EQUIPMENT:   FRONTALIS SLING INSTRUMENTS SELFF ROD   TONSILLECTOMY      Family History  Problem Relation Age of Onset   Hypertension Mother    Stroke Father     Social History   Tobacco Use   Smoking status: Every Day    Current packs/day: 1.00    Average packs/day: 1 pack/day for 20.0 years (20.0 ttl pk-yrs)    Types: Cigarettes   Smokeless tobacco: Never  Substance Use Topics   Alcohol use: Yes    Alcohol/week: 5.0 standard drinks of alcohol    Types: 5 Cans of beer per week     Current Outpatient Medications:    Cholecalciferol (VITAMIN D-3) 25 MCG (1000 UT) CAPS, Take 1 capsule by mouth daily., Disp: , Rfl:    Multiple Vitamin (MULTIVITAMIN WITH MINERALS) TABS tablet, Take 1 tablet by mouth daily. Reported on 08/30/2015, Disp: , Rfl:    acetaminophen (TYLENOL) 500 MG tablet, Take 1,000 mg by mouth 2 (two) times daily. am (Patient not taking: Reported on 12/15/2022), Disp: , Rfl:    celecoxib (CELEBREX) 200  MG capsule, Take 1 capsule (200 mg total) by mouth daily. (Patient not taking: Reported on 12/15/2022), Disp: 30 capsule, Rfl: 0   esomeprazole (NEXIUM) 20 MG capsule, Take by mouth. (Patient not taking: Reported on 12/15/2022), Disp: , Rfl:    rosuvastatin (CRESTOR) 20 MG tablet, Take 1 tablet (20 mg total) by mouth daily. (Patient not taking: Reported on 12/15/2022), Disp: 90 tablet, Rfl: 1   tiZANidine (ZANAFLEX) 4 MG tablet, Take 1 tablet (4 mg total) by mouth every 6 (six) hours as needed for muscle spasms. (Patient not taking: Reported on 12/15/2022), Disp: 60 tablet, Rfl: 0  Allergies  Allergen Reactions   Ceftriaxone Hives   Meropenem     During hospitalization 2/22- 08/22/2013 patient had persistent fevers while on meropenem. 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 Known hives  with ceftriaxone.    I personally reviewed active problem list, medication list, allergies, family history, social history, health maintenance with the patient/caregiver today.   ROS  Ten systems reviewed and is negative except as mentioned in HPI    Objective  Vitals:   12/15/22 1142  BP: 120/68  Pulse: 93  Resp: 16  SpO2: 98%  Weight: 229 lb (103.9 kg)  Height: 6\' 4"  (1.93 m)    Body mass index is 27.87 kg/m.  Physical Exam   Constitutional: Patient appears well-developed and well-nourished.  No distress.  HEENT: head atraumatic, normocephalic, pupils equal and reactive to light, neck supple Cardiovascular: Normal rate, regular rhythm and normal heart sounds.  No murmur heard. No BLE edema. Pulmonary/Chest: Effort normal and breath sounds normal. No respiratory distress. Abdominal: Soft.  There is no tenderness. Psychiatric: Patient has a normal mood and affect. behavior is normal. Judgment and thought content normal.    PHQ2/9:    12/15/2022   11:41 AM 05/30/2022   10:49 AM 05/29/2021   11:05 AM 08/24/2020   10:26 AM 05/23/2020   11:02 AM  Depression screen PHQ 2/9  Decreased Interest 0 0 0 0 0  Down, Depressed, Hopeless 0 0 0 0 0  PHQ - 2 Score 0 0 0 0 0  Altered sleeping 0 0 0  0  Tired, decreased energy 0 0 0  0  Change in appetite 0 0 0  0  Feeling bad or failure about yourself  0 0 0  1  Trouble concentrating 0 0 0  0  Moving slowly or fidgety/restless 0 0 0  0  Suicidal thoughts 0 0 0  0  PHQ-9 Score 0 0 0  1  Difficult doing work/chores     Not difficult at all    phq 9 is negative   Fall Risk:    12/15/2022   11:41 AM 05/30/2022   10:49 AM 05/29/2021   11:05 AM 08/24/2020   10:25 AM 05/23/2020   10:43 AM  Fall Risk   Falls in the past year? 0 0 0 0 1  Number falls in past yr: 0  0 0 1  Injury with Fall? 0  0  0  Risk for fall due to : No Fall Risks No Fall Risks No Fall Risks  History of fall(s);Impaired balance/gait;Other (Comment)   Follow up Falls prevention discussed Falls prevention discussed;Education provided;Falls evaluation completed Falls prevention discussed        Functional Status Survey: Is the patient deaf or have difficulty hearing?: Yes Does the patient have difficulty seeing, even when wearing glasses/contacts?: Yes Does the patient have difficulty concentrating, remembering, or making decisions?:  No Does the patient have difficulty walking or climbing stairs?: No Does the patient have difficulty dressing or bathing?: No Does the patient have difficulty doing errands alone such as visiting a doctor's office or shopping?: No    Assessment & Plan   1. Pure hypercholesterolemia  - rosuvastatin (CRESTOR) 20 MG tablet; Take 1 tablet (20 mg total) by mouth daily.  Dispense: 90 tablet; Refill: 1  2. Vitamin D insufficiency   Remind him to take supplements daily   3. Bilateral sensorineural hearing loss  Wearing hearing aids, but not really working

## 2022-12-15 ENCOUNTER — Encounter: Payer: Self-pay | Admitting: Family Medicine

## 2022-12-15 ENCOUNTER — Ambulatory Visit (INDEPENDENT_AMBULATORY_CARE_PROVIDER_SITE_OTHER): Payer: Medicaid Other | Admitting: Family Medicine

## 2022-12-15 VITALS — BP 120/68 | HR 93 | Resp 16 | Ht 76.0 in | Wt 229.0 lb

## 2022-12-15 DIAGNOSIS — E559 Vitamin D deficiency, unspecified: Secondary | ICD-10-CM | POA: Diagnosis not present

## 2022-12-15 DIAGNOSIS — E7800 Pure hypercholesterolemia, unspecified: Secondary | ICD-10-CM | POA: Diagnosis not present

## 2022-12-15 DIAGNOSIS — H903 Sensorineural hearing loss, bilateral: Secondary | ICD-10-CM | POA: Diagnosis not present

## 2022-12-15 MED ORDER — ROSUVASTATIN CALCIUM 20 MG PO TABS: 20.0000 mg | ORAL_TABLET | Freq: Every day | ORAL | 1 refills | Status: DC

## 2023-01-01 DIAGNOSIS — Z419 Encounter for procedure for purposes other than remedying health state, unspecified: Secondary | ICD-10-CM | POA: Diagnosis not present

## 2023-02-01 DIAGNOSIS — Z419 Encounter for procedure for purposes other than remedying health state, unspecified: Secondary | ICD-10-CM | POA: Diagnosis not present

## 2023-03-03 DIAGNOSIS — Z419 Encounter for procedure for purposes other than remedying health state, unspecified: Secondary | ICD-10-CM | POA: Diagnosis not present

## 2023-04-03 DIAGNOSIS — Z419 Encounter for procedure for purposes other than remedying health state, unspecified: Secondary | ICD-10-CM | POA: Diagnosis not present

## 2023-05-03 DIAGNOSIS — Z419 Encounter for procedure for purposes other than remedying health state, unspecified: Secondary | ICD-10-CM | POA: Diagnosis not present

## 2023-06-02 ENCOUNTER — Encounter: Payer: Medicaid Other | Admitting: Family Medicine

## 2023-06-14 DIAGNOSIS — Z419 Encounter for procedure for purposes other than remedying health state, unspecified: Secondary | ICD-10-CM | POA: Diagnosis not present

## 2023-06-18 ENCOUNTER — Encounter: Payer: Self-pay | Admitting: Family Medicine

## 2023-06-18 ENCOUNTER — Ambulatory Visit (INDEPENDENT_AMBULATORY_CARE_PROVIDER_SITE_OTHER): Payer: Medicaid Other | Admitting: Family Medicine

## 2023-06-18 VITALS — BP 142/80 | HR 99 | Temp 98.2°F | Resp 18 | Ht 76.0 in | Wt 236.6 lb

## 2023-06-18 DIAGNOSIS — E559 Vitamin D deficiency, unspecified: Secondary | ICD-10-CM

## 2023-06-18 DIAGNOSIS — Z0000 Encounter for general adult medical examination without abnormal findings: Secondary | ICD-10-CM | POA: Diagnosis not present

## 2023-06-18 DIAGNOSIS — E7800 Pure hypercholesterolemia, unspecified: Secondary | ICD-10-CM | POA: Diagnosis not present

## 2023-06-18 DIAGNOSIS — Z113 Encounter for screening for infections with a predominantly sexual mode of transmission: Secondary | ICD-10-CM | POA: Diagnosis not present

## 2023-06-18 DIAGNOSIS — Z79899 Other long term (current) drug therapy: Secondary | ICD-10-CM

## 2023-06-18 DIAGNOSIS — Z0001 Encounter for general adult medical examination with abnormal findings: Secondary | ICD-10-CM | POA: Diagnosis not present

## 2023-06-18 DIAGNOSIS — Z9621 Cochlear implant status: Secondary | ICD-10-CM | POA: Diagnosis not present

## 2023-06-18 DIAGNOSIS — Z23 Encounter for immunization: Secondary | ICD-10-CM | POA: Diagnosis not present

## 2023-06-18 DIAGNOSIS — Z131 Encounter for screening for diabetes mellitus: Secondary | ICD-10-CM | POA: Diagnosis not present

## 2023-06-18 DIAGNOSIS — Z125 Encounter for screening for malignant neoplasm of prostate: Secondary | ICD-10-CM | POA: Diagnosis not present

## 2023-06-18 NOTE — Progress Notes (Signed)
Name: Raymond Gibson   MRN: [id number]    DOB: [birth year]   Date:06/18/2023       Progress Note  Subjective  Chief Complaint  Chief Complaint  Patient presents with   Annual Exam    HPI  Patient presents for annual CPE .   IPSS     Row Name 06/18/23 0902         International Prostate Symptom Score   How often have you had the sensation of not emptying your bladder? Not at All     How often have you had to urinate less than every two hours? Not at All     How often have you found you stopped and started again several times when you urinated? Not at All     How often have you found it difficult to postpone urination? Not at All     How often have you had a weak urinary stream? Not at All     How often have you had to strain to start urination? Not at All     How many times did you typically get up at night to urinate? 2 Times  states drinkes alot of water     Total IPSS Score 2       Quality of Life due to urinary symptoms   If you were to spend the rest of your life with your urinary condition just the way it is now how would you feel about that? Pleased              Diet: still eats out frequently, discussed healthier options Exercise: continue regular physical activity  Last Dental Exam: he has dentures advised to check gum on a regular basis  Last Eye Exam: he will schedule it   Depression: phq 9 is negative    06/18/2023    8:56 AM 12/15/2022   11:41 AM 05/30/2022   10:49 AM 05/29/2021   11:05 AM 08/24/2020   10:26 AM  Depression screen PHQ 2/9  Decreased Interest 0 0 0 0 0  Down, Depressed, Hopeless 0 0 0 0 0  PHQ - 2 Score 0 0 0 0 0  Altered sleeping 0 0 0 0   Tired, decreased energy 0 0 0 0   Change in appetite 0 0 0 0   Feeling bad or failure about yourself  0 0 0 0   Trouble concentrating 0 0 0 0   Moving slowly or fidgety/restless 0 0 0 0   Suicidal thoughts 0 0 0 0   PHQ-9 Score 0 0 0 0   Difficult doing work/chores Not difficult at all         Hypertension:  BP Readings from Last 3 Encounters:  06/18/23 (!) 142/80  12/15/22 120/68  05/30/22 122/76    Obesity: Wt Readings from Last 3 Encounters:  06/18/23 236 lb 9.6 oz (107.3 kg)  12/15/22 229 lb (103.9 kg)  05/30/22 232 lb 12.8 oz (105.6 kg)   BMI Readings from Last 3 Encounters:  06/18/23 28.80 kg/m  12/15/22 27.87 kg/m  05/30/22 27.97 kg/m     Flowsheet Row Office Visit from 05/30/20[facility]  AUDIT-C Score 5       Divorced STD testing and prevention (HIV/chl/gon/syphilis):  yes Sexual history: one male partner for many years  Hep C Screening: completed Skin cancer: Discussed monitoring for atypical lesions Colorectal cancer: 06/09/2020  Prostate cancer:  yes Lab Results  Component Value Date  PSA 1.48 05/30/2022   PSA 2.39 05/29/2021   PSA 1.58 05/23/2020     Lung cancer:  Low Dose CT Chest recommended if Age 70-80 years, 30 pack-year currently smoking OR have quit w/in 15years. Patient  is a candidate for screening  but refused  AAA: The USPSTF recommends one-time screening with ultrasonography in men ages 34 to 23 years who have ever smoked. Patient   is not a candidate for screening  ECG:  2017   Vaccines: reviewed with the patient.   Advanced Care Planning: A voluntary discussion about advance care planning including the explanation and discussion of advance directives.  Discussed health care proxy and Living will, and the patient was able to identify a health care proxy as Jessee Avers .  Patient does not have a living will and power of attorney of health care   Patient Active Problem List   Diagnosis Date Noted   History of colonic polyps    Polyp of sigmoid colon    Hyperglycemia 11/20/2015   Vitamin D insufficiency 08/30/2015   ED (erectile dysfunction) 08/30/2015   Benign neoplasm of transverse colon    Benign neoplasm of sigmoid colon    Snores 01/18/2014   GERD (gastroesophageal reflux  disease) 01/18/2014   Bilateral sensorineural hearing loss 08/30/2013   Buzzing in ear 08/30/2013    Past Surgical History:  Procedure Laterality Date   COLONOSCOPY WITH PROPOFOL N/A 05/22/2015   Procedure: COLONOSCOPY WITH PROPOFOL;  Surgeon: Midge Minium, MD;  Location: [facility] ENDOSCOPY;  Service: Endoscopy;  Laterality: N/A;   COLONOSCOPY WITH PROPOFOL N/A 06/19/2020   Procedure: COLONOSCOPY WITH PROPOFOL;  Surgeon: Midge Minium, MD;  Location: [facility] ENDOSCOPY;  Service: Endoscopy;  Laterality: N/A;   ELBOW SURGERY     REMOVAL OF ELBOW SPUR   FOOT SURGERY  1978   INNER EAR SURGERY     KNEE SURGERY  07/2013   PTOSIS REPAIR Right 09/04/2015   Procedure: ptosis repair right eye with biopsy of conjunctiva;  Surgeon: Imagene Riches, MD;  Location: [facility];  Service: Ophthalmology;  Laterality: Right;  WITH LMA VERY HARD OF HEARING/SPEAK SLOWLY SPECIAL EQUIPMENT:   FRONTALIS SLING INSTRUMENTS SELFF ROD   TONSILLECTOMY      Family History  Problem Relation Age of Onset   Hypertension Mother    Stroke Father     Social History   Socioeconomic History   Marital status: Divorced    Spouse name: Not on file   Number of children: 0   Years of education: Not on file   Highest education level: 12th grade  Occupational History   Occupation: Disabled  Tobacco Use   Smoking status: Every Day    Current packs/day: 1.00    Average packs/day: 1 pack/day for 40.0 years (40.0 ttl pk-yrs)    Types: Cigarettes   Smokeless tobacco: Never  Vaping Use   Vaping status: Never Used  Substance and Sexual Activity   Alcohol use: Yes    Alcohol/week: 5.0 standard drinks of alcohol    Types: 5 Cans of beer per week   Drug use: No   Sexual activity: Yes    Partners: Female  Other Topics Concern   Not on file  Social History Narrative   Not on file   Social Drivers of Health   Financial Resource Strain: Low Risk  (05/30/2022)   Overall Financial Resource Strain (CARDIA)    Difficulty  of Paying Living Expenses: Not hard at all  Food Insecurity: No  Food Insecurity (05/30/2022)   Hunger Vital Sign    Worried About Running Out of Food in the Last Year: Never true    Ran Out of Food in the Last Year: Never true  Transportation Needs: No Transportation Needs (05/30/2022)   PRAPARE - Administrator, Civil Service (Medical): No    Lack of Transportation (Non-Medical): No  Physical Activity: Sufficiently Active (05/30/2022)   Exercise Vital Sign    Days of Exercise per Week: 4 days    Minutes of Exercise per Session: 60 min  Stress: No Stress Concern Present (05/30/2022)   [employer] of Occupational Health - Occupational Stress Questionnaire    Feeling of Stress : Not at all  Social Connections: Moderately Isolated (05/30/2022)   Social Connection and Isolation Panel [NHANES]    Frequency of Communication with Friends and Family: More than three times a week    Frequency of Social Gatherings with Friends and Family: More than three times a week    Attends Religious Services: More than 4 times per year    Active Member of [employer] or Organizations: No    Attends Banker Meetings: Never    Marital Status: Divorced  Catering manager Violence: Not At Risk (05/30/2022)   Humiliation, Afraid, Rape, and Kick questionnaire    Fear of Current or Ex-Partner: No    Emotionally Abused: No    Physically Abused: No    Sexually Abused: No     Current Outpatient Medications:    Cholecalciferol (VITAMIN D-3) 25 MCG (1000 UT) CAPS, Take 1 capsule by mouth daily., Disp: , Rfl:    Multiple Vitamin (MULTIVITAMIN WITH MINERALS) TABS tablet, Take 1 tablet by mouth daily., Disp: , Rfl:    rosuvastatin (CRESTOR) 20 MG tablet, Take 1 tablet (20 mg total) by mouth daily., Disp: 90 tablet, Rfl: 1  Allergies  Allergen Reactions   Ceftriaxone Hives   Meropenem     During hospitalization 2/22- 08/22/2013 patient had persistent fevers while on meropenem. Thought is  that patient has beta-lactam allergy. Known hives with ceftriaxone.     ROS  Constitutional: Negative for fever or weight change.  Respiratory: Negative for cough and shortness of breath.   Cardiovascular: Negative for chest pain or palpitations.  Gastrointestinal: Negative for abdominal pain, no bowel changes.  Musculoskeletal: Negative for gait problem or joint swelling.  Skin: Negative for rash.  Neurological: Negative for dizziness or headache.  No other specific complaints in a complete review of systems (except as listed in HPI above).    Objective  Vitals:   06/18/23 0847 06/18/23 0932  BP: (!) 142/80 (!) 142/80  Pulse: 99   Resp: 18   Temp: 98.2 F (36.8 C)   TempSrc: Oral   SpO2: 100%   Weight: 236 lb 9.6 oz (107.3 kg)   Height: 6\' 4"  (1.93 m)     Body mass index is 28.8 kg/m.  Physical Exam  Constitutional: Patient appears well-developed and well-nourished. No distress.  HENT: Head: Normocephalic and atraumatic. Ears: hearing aid on left side and some wax on left side , right TMs ok, no erythema or effusion; Nose: Nose normal. Mouth/Throat: Oropharynx is clear and moist. No oropharyngeal exudate.  Eyes: Conjunctivae and EOM are normal. Pupils are equal, round, and reactive to light. No scleral icterus.  Neck: Normal range of motion. Neck supple. No JVD present. No thyromegaly present.  Cardiovascular: Normal rate, regular rhythm and normal heart sounds.  No murmur heard. No BLE edema.  Pulmonary/Chest: Effort normal and breath sounds normal. No respiratory distress. Abdominal: Soft. Bowel sounds are normal, no distension. There is no tenderness. no masses MALE GENITALIA: Normal descended testes bilaterally, no masses palpated, no hernias, no lesions, no discharge RECTAL: not done  Musculoskeletal: Normal range of motion, no joint effusions. No gross deformities Neurological: he is alert and oriented to person, place, and time. No cranial nerve deficit.  Coordination, balance, strength, speech and gait are normal.  Skin: Skin is warm and dry. No rash noted. No erythema.  Psychiatric: Patient has a normal mood and affect. behavior is normal. Judgment and thought content normal.    Assessment & Plan  1. Well adult exam (Primary)  - Lipid panel - COMPLETE METABOLIC PANEL WITH GFR - CBC with Differential/Platelet - Hemoglobin A1c - HIV Antibody (routine testing w rflx) - RPR - PSA  2. Need for influenza vaccination  - Flu vaccine trivalent PF, 6mos and older(Flulaval,Afluria,Fluarix,Fluzone)  3. Vitamin D insufficiency  - VITAMIN D 25 Hydroxy (Vit-D Deficiency, Fractures)  4. Diabetes mellitus screening  - Hemoglobin A1c  5. Long-term use of high-risk medication  - COMPLETE METABOLIC PANEL WITH GFR - CBC with Differential/Platelet  6. Routine screening for STI (sexually transmitted infection)  - HIV Antibody (routine testing w rflx) - RPR  7. Cochlear implant status   8. Prostate cancer screening  - PSA  9. Pure hypercholesterolemia  - Lipid panel   -Prostate cancer screening and PSA options (with potential risks and benefits of testing vs not testing) were discussed along with recent recs/guidelines. -USPSTF grade A and B recommendations reviewed with patient; age-appropriate recommendations, preventive care, screening tests, etc discussed and encouraged; healthy living encouraged; see AVS for patient education given to patient -Discussed importance of 150 minutes of physical activity weekly, eat two servings of fish weekly, eat one serving of tree nuts ( cashews, pistachios, pecans, almonds.Marland Kitchen) every other day, eat 6 servings of fruit/vegetables daily and drink plenty of water and avoid sweet beverages.  -Reviewed Health Maintenance: yes

## 2023-06-20 LAB — COMPLETE METABOLIC PANEL WITH GFR
AG Ratio: 1.5 (calc) (ref 1.0–2.5)
ALT: 16 U/L (ref 9–46)
AST: 23 U/L (ref 10–35)
Albumin: 4.1 g/dL (ref 3.6–5.1)
Alkaline phosphatase (APISO): 52 U/L (ref 35–144)
BUN: 15 mg/dL (ref 7–25)
CO2: 27 mmol/L (ref 20–32)
Calcium: 9.5 mg/dL (ref 8.6–10.3)
Chloride: 105 mmol/L (ref 98–110)
Creat: 1.24 mg/dL (ref 0.70–1.35)
Globulin: 2.7 g/dL (ref 1.9–3.7)
Glucose, Bld: 103 mg/dL — ABNORMAL HIGH (ref 65–99)
Potassium: 4.1 mmol/L (ref 3.5–5.3)
Sodium: 140 mmol/L (ref 135–146)
Total Bilirubin: 1 mg/dL (ref 0.2–1.2)
Total Protein: 6.8 g/dL (ref 6.1–8.1)
eGFR: 65 mL/min/{1.73_m2} (ref >=60)

## 2023-06-20 LAB — CBC WITH DIFFERENTIAL/PLATELET
Absolute Lymphocytes: 1663 {cells}/uL (ref 850–3900)
Absolute Monocytes: 731 {cells}/uL (ref 200–950)
Basophils Absolute: 48 {cells}/uL (ref 0–200)
Basophils Relative: 0.7 %
Eosinophils Absolute: 83 {cells}/uL (ref 15–500)
Eosinophils Relative: 1.2 %
HCT: 45.7 % (ref 38.5–50.0)
Hemoglobin: 15.5 g/dL (ref 13.2–17.1)
MCH: 32.2 pg (ref 27.0–33.0)
MCHC: 33.9 g/dL (ref 32.0–36.0)
MCV: 95 fL (ref 80.0–100.0)
MPV: 9.4 fL (ref 7.5–12.5)
Monocytes Relative: 10.6 %
Neutro Abs: 4375 {cells}/uL (ref 1500–7800)
Neutrophils Relative %: 63.4 %
Platelets: 294 10*3/uL (ref 140–400)
RBC: 4.81 10*6/uL (ref 4.20–5.80)
RDW: 12.2 % (ref 11.0–15.0)
Total Lymphocyte: 24.1 %
WBC: 6.9 10*3/uL (ref 3.8–10.8)

## 2023-06-20 LAB — PSA: PSA: 2.06 ng/mL (ref <=4.00)

## 2023-06-20 LAB — RPR: RPR Ser Ql: NONREACTIVE

## 2023-06-20 LAB — LIPID PANEL
Cholesterol: 125 mg/dL (ref <200)
HDL: 68 mg/dL (ref >=40)
LDL Cholesterol (Calc): 43 mg/dL
Non-HDL Cholesterol (Calc): 57 mg/dL (ref <130)
Total CHOL/HDL Ratio: 1.8 (calc) (ref <5.0)
Triglycerides: 55 mg/dL (ref <150)

## 2023-06-20 LAB — VITAMIN D 25 HYDROXY (VIT D DEFICIENCY, FRACTURES): Vit D, 25-Hydroxy: 32 ng/mL (ref 30–100)

## 2023-06-20 LAB — HEMOGLOBIN A1C
Hgb A1c MFr Bld: 5.9 %{Hb} — ABNORMAL HIGH (ref <5.7)
Mean Plasma Glucose: 123 mg/dL
eAG (mmol/L): 6.8 mmol/L

## 2023-06-20 LAB — HIV ANTIBODY (ROUTINE TESTING W REFLEX): HIV 1&2 Ab, 4th Generation: NONREACTIVE

## 2023-07-04 DIAGNOSIS — Z419 Encounter for procedure for purposes other than remedying health state, unspecified: Secondary | ICD-10-CM | POA: Diagnosis not present

## 2023-08-01 DIAGNOSIS — Z419 Encounter for procedure for purposes other than remedying health state, unspecified: Secondary | ICD-10-CM | POA: Diagnosis not present

## 2023-09-12 DIAGNOSIS — Z419 Encounter for procedure for purposes other than remedying health state, unspecified: Secondary | ICD-10-CM | POA: Diagnosis not present

## 2023-10-12 DIAGNOSIS — Z419 Encounter for procedure for purposes other than remedying health state, unspecified: Secondary | ICD-10-CM | POA: Diagnosis not present

## 2023-11-12 DIAGNOSIS — Z419 Encounter for procedure for purposes other than remedying health state, unspecified: Secondary | ICD-10-CM | POA: Diagnosis not present

## 2023-12-12 DIAGNOSIS — Z419 Encounter for procedure for purposes other than remedying health state, unspecified: Secondary | ICD-10-CM | POA: Diagnosis not present

## 2023-12-16 ENCOUNTER — Encounter: Payer: Self-pay | Admitting: Family Medicine

## 2023-12-16 ENCOUNTER — Ambulatory Visit: Payer: Self-pay | Admitting: Family Medicine

## 2023-12-16 VITALS — BP 130/84 | HR 96 | Resp 16 | Ht 76.0 in | Wt 244.7 lb

## 2023-12-16 DIAGNOSIS — Z9621 Cochlear implant status: Secondary | ICD-10-CM

## 2023-12-16 DIAGNOSIS — E559 Vitamin D deficiency, unspecified: Secondary | ICD-10-CM

## 2023-12-16 DIAGNOSIS — H903 Sensorineural hearing loss, bilateral: Secondary | ICD-10-CM

## 2023-12-16 DIAGNOSIS — E7800 Pure hypercholesterolemia, unspecified: Secondary | ICD-10-CM

## 2023-12-16 DIAGNOSIS — R7303 Prediabetes: Secondary | ICD-10-CM | POA: Diagnosis not present

## 2023-12-16 MED ORDER — ROSUVASTATIN CALCIUM 20 MG PO TABS: 20.0000 mg | ORAL_TABLET | Freq: Every day | ORAL | 3 refills | Status: AC

## 2023-12-16 MED ORDER — ROSUVASTATIN CALCIUM 20 MG PO TABS: 20.0000 mg | ORAL_TABLET | Freq: Every day | ORAL | 1 refills | Status: DC

## 2023-12-16 NOTE — Progress Notes (Signed)
 Name: Raymond Gibson   MRN: [id number]    DOB: February 10, 1961   Date:12/16/2023       Progress Note  Subjective  Chief Complaint  Chief Complaint  Patient presents with   Medical Management of Chronic Issues    Has not been taking crestor  due to out of med   HPI   Hyperlipidemia: he ran our of medication recently but usually takes Rosuvastatin  daily  and denies side effects.    Pre diabetes: he is physically active, A1C trending up but denies polyphagia, polydipsia or polyuria. Discussed decreasing carbohydrate intake. He states he likes chips.    Hearing loss/Cochlear implant: he was wearing hearing aids but still could not hear well during his visit   Vitamin D  deficiency: taking supplements as advised  Patient Active Problem List   Diagnosis Date Noted   History of colonic polyps    Polyp of sigmoid colon    Hyperglycemia 11/20/2015   Vitamin D  insufficiency 08/30/2015   ED (erectile dysfunction) 08/30/2015   Benign neoplasm of transverse colon    Benign neoplasm of sigmoid colon    Snores 01/18/2014   GERD (gastroesophageal reflux disease) 01/18/2014   Bilateral sensorineural hearing loss 08/30/2013   Buzzing in ear 08/30/2013    Past Surgical History:  Procedure Laterality Date   COLONOSCOPY WITH PROPOFOL  N/A 05/22/2015   Procedure: COLONOSCOPY WITH PROPOFOL ;  Surgeon: Rogelia Copping, MD;  Location: [facility] ENDOSCOPY;  Service: Endoscopy;  Laterality: N/A;   COLONOSCOPY WITH PROPOFOL  N/A 06/19/2020   Procedure: COLONOSCOPY WITH PROPOFOL ;  Surgeon: Copping Rogelia, MD;  Location: [facility] ENDOSCOPY;  Service: Endoscopy;  Laterality: N/A;   ELBOW SURGERY     REMOVAL OF ELBOW SPUR   FOOT SURGERY  1978   INNER EAR SURGERY     KNEE SURGERY  07/2013   PTOSIS REPAIR Right 09/04/2015   Procedure: ptosis repair right eye with biopsy of conjunctiva;  Surgeon: Greig CHRISTELLA Gay, MD;  Location: [facility];  Service: Ophthalmology;  Laterality: Right;  WITH LMA VERY HARD OF HEARING/SPEAK  SLOWLY SPECIAL EQUIPMENT:   FRONTALIS SLING INSTRUMENTS SELFF ROD   TONSILLECTOMY      Family History  Problem Relation Age of Onset   Hypertension Mother    Stroke Father     Social History   Tobacco Use   Smoking status: Every Day    Current packs/day: 1.00    Average packs/day: 1 pack/day for 40.0 years (40.0 ttl pk-yrs)    Types: Cigarettes   Smokeless tobacco: Never  Substance Use Topics   Alcohol use: Yes    Alcohol/week: 5.0 standard drinks of alcohol    Types: 5 Cans of beer per week     Current Outpatient Medications:    Cholecalciferol (VITAMIN D -3) 25 MCG (1000 UT) CAPS, Take 1 capsule by mouth daily., Disp: , Rfl:    Multiple Vitamin (MULTIVITAMIN WITH MINERALS) TABS tablet, Take 1 tablet by mouth daily., Disp: , Rfl:    rosuvastatin  (CRESTOR ) 20 MG tablet, Take 1 tablet (20 mg total) by mouth daily., Disp: 90 tablet, Rfl: 1  Allergies  Allergen Reactions   Ceftriaxone Hives   Meropenem     During hospitalization 2/22- 08/22/2013 patient had persistent fevers while on meropenem. 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 Known hives with ceftriaxone.    I personally reviewed active problem list, medication list, allergies with the patient/caregiver today.   ROS  Ten systems reviewed and is negative except as mentioned in HPI  Objective Physical Exam Constitutional: Patient appears well-developed and well-nourished.  No distress.  HEENT: head atraumatic, normocephalic, pupils equal and reactive to light, ears hearing aid present left but still unable to hear well, note taken by writing it down , neck supple Cardiovascular: Normal rate, regular rhythm and normal heart sounds.  No murmur heard. No BLE edema. Pulmonary/Chest: Effort normal and breath sounds normal. No respiratory distress. Abdominal: Soft.  There is no tenderness. Psychiatric: Patient has a normal mood and affect. behavior is normal. Judgment and thought content normal.   Vitals:    12/16/23 0954  BP: 130/84  Pulse: 96  Resp: 16  SpO2: 99%  Weight: 244 lb 11.2 oz (111 kg)  Height: 6' 4 (1.93 m)    Body mass index is 29.79 kg/m.    PHQ2/9:    12/16/2023    9:51 AM 06/18/2023    8:56 AM 12/15/2022   11:41 AM 05/30/2022   10:49 AM 05/29/2021   11:05 AM  Depression screen PHQ 2/9  Decreased Interest 0 0 0 0 0  Down, Depressed, Hopeless 0 0 0 0 0  PHQ - 2 Score 0 0 0 0 0  Altered sleeping  0 0 0 0  Tired, decreased energy  0 0 0 0  Change in appetite  0 0 0 0  Feeling bad or failure about yourself   0 0 0 0  Trouble concentrating  0 0 0 0  Moving slowly or fidgety/restless  0 0 0 0  Suicidal thoughts  0 0 0 0  PHQ-9 Score  0 0 0 0  Difficult doing work/chores  Not difficult at all       phq 9 is negative  Fall Risk:    12/16/2023    9:51 AM 06/18/2023    9:02 AM 12/15/2022   11:41 AM 05/30/2022   10:49 AM 05/29/2021   11:05 AM  Fall Risk   Falls in the past year? 0 0 0 0 0  Number falls in past yr: 0 0 0  0  Injury with Fall? 0 0 0  0  Risk for fall due to : No Fall Risks No Fall Risks No Fall Risks No Fall Risks No Fall Risks  Follow up Falls evaluation completed Falls prevention discussed;Education provided;Falls evaluation completed Falls prevention discussed Falls prevention discussed;Education provided;Falls evaluation completed  Falls prevention discussed      Data saved with a previous flowsheet row definition     Assessment & Plan  1. Pure hypercholesterolemia (Primary)  - EKG 12-Lead - normal sinus rhythm  - rosuvastatin  (CRESTOR ) 20 MG tablet; Take 1 tablet (20 mg total) by mouth daily.  Dispense: 90 tablet; Refill: 3  2. Vitamin D  insufficiency  Continue supplements   3. Bilateral sensorineural hearing loss  Still not able to hear well, uses hearing aid  4. Cochlear implant status  unchanged  5. Pre-diabetes  Discussed low carbohydrate diet

## 2024-02-16 ENCOUNTER — Encounter: Payer: Self-pay | Admitting: Nurse Practitioner

## 2024-02-16 ENCOUNTER — Ambulatory Visit (INDEPENDENT_AMBULATORY_CARE_PROVIDER_SITE_OTHER): Admitting: Nurse Practitioner

## 2024-02-16 VITALS — BP 124/72 | HR 95 | Resp 18 | Ht 76.0 in | Wt 244.0 lb

## 2024-02-16 DIAGNOSIS — H903 Sensorineural hearing loss, bilateral: Secondary | ICD-10-CM | POA: Diagnosis not present

## 2024-02-16 NOTE — Progress Notes (Signed)
 BP 124/72   Pulse 95   Resp 18   Ht 6' 4 (1.93 m)   Wt 244 lb (110.7 kg)   SpO2 99%   BMI 29.70 kg/m    Subjective:    Patient ID: Raymond Gibson, male    DOB: 1960/10/14, 63 y.o.   MRN: [id number]  HPI: Raymond Gibson is a 63 y.o. male  Chief Complaint  Patient presents with   Referral    [facility] otolaryngology Dr. Delores   Discussed the use of AI scribe software for clinical note transcription with the patient, who gave verbal consent to proceed.  History of Present Illness Raymond Gibson is a 63 year old male who presents with decreased hearing.  Hearing loss - Decreased hearing persists despite the use of hearing aids - Impaired hearing continues to impact daily communication -needs referral to [facility] otolaryngology Dr. Delores         02/16/2024   10:24 AM 12/16/2023    9:51 AM 06/18/2023    8:56 AM  Depression screen PHQ 2/9  Decreased Interest 0 0 0  Down, Depressed, Hopeless 0 0 0  PHQ - 2 Score 0 0 0  Altered sleeping 0  0  Tired, decreased energy 0  0  Change in appetite 0  0  Feeling bad or failure about yourself  0  0  Trouble concentrating 0  0  Moving slowly or fidgety/restless 0  0  Suicidal thoughts 0  0  PHQ-9 Score 0  0  Difficult doing work/chores Not difficult at all  Not difficult at all    Relevant past medical, surgical, family and social history reviewed and updated as indicated. Interim medical history since our last visit reviewed. Allergies and medications reviewed and updated.  Review of Systems  Ten systems reviewed and is negative except as mentioned in HPI      Objective:      BP 124/72   Pulse 95   Resp 18   Ht 6' 4 (1.93 m)   Wt 244 lb (110.7 kg)   SpO2 99%   BMI 29.70 kg/m    Wt Readings from Last 3 Encounters:  02/16/24 244 lb (110.7 kg)  12/16/23 244 lb 11.2 oz (111 kg)  06/18/23 236 lb 9.6 oz (107.3 kg)    Physical Exam GENERAL: Alert, cooperative, well developed, no acute distress HEENT:  Normocephalic, normal oropharynx, moist mucous membranes CHEST: Clear to auscultation bilaterally, No wheezes, rhonchi, or crackles CARDIOVASCULAR: Normal heart rate and rhythm, S1 and S2 normal without murmurs ABDOMEN: Soft, non-tender, non-distended, without organomegaly, Normal bowel sounds EXTREMITIES: No cyanosis or edema NEUROLOGICAL: Cranial nerves grossly intact, Moves all extremities without gross motor or sensory deficit  Results for orders placed or performed in visit on 06/18/23  Lipid panel   Collection Time: 06/18/23  9:33 AM  Result Value Ref Range   Cholesterol 125 <200 mg/dL   HDL 68 > OR = 40 mg/dL   Triglycerides 55 <849 mg/dL   LDL Cholesterol (Calc) 43 mg/dL (calc)   Total CHOL/HDL Ratio 1.8 <5.0 (calc)   Non-HDL Cholesterol (Calc) 57 <869 mg/dL (calc)  COMPLETE METABOLIC PANEL WITH GFR   Collection Time: 06/18/23  9:33 AM  Result Value Ref Range   Glucose, Bld 103 (H) 65 - 99 mg/dL   BUN 15 7 - 25 mg/dL   Creat 8.75 9.29 - 8.64 mg/dL   eGFR 65 > OR = 60 fO/fpw/8.26f7   BUN/Creatinine Ratio SEE NOTE:  6 - 22 (calc)   Sodium 140 135 - 146 mmol/L   Potassium 4.1 3.5 - 5.3 mmol/L   Chloride 105 98 - 110 mmol/L   CO2 27 20 - 32 mmol/L   Calcium  9.5 8.6 - 10.3 mg/dL   Total Protein 6.8 6.1 - 8.1 g/dL   Albumin 4.1 3.6 - 5.1 g/dL   Globulin 2.7 1.9 - 3.7 g/dL (calc)   AG Ratio 1.5 1.0 - 2.5 (calc)   Total Bilirubin 1.0 0.2 - 1.2 mg/dL   Alkaline phosphatase (APISO) 52 35 - 144 U/L   AST 23 10 - 35 U/L   ALT 16 9 - 46 U/L  CBC with Differential/Platelet   Collection Time: 06/18/23  9:33 AM  Result Value Ref Range   WBC 6.9 3.8 - 10.8 Thousand/uL   RBC 4.81 4.20 - 5.80 Million/uL   Hemoglobin 15.5 13.2 - 17.1 g/dL   HCT 54.2 61.4 - 49.9 %   MCV 95.0 80.0 - 100.0 fL   MCH 32.2 27.0 - 33.0 pg   MCHC 33.9 32.0 - 36.0 g/dL   RDW 87.7 88.9 - 84.9 %   Platelets 294 140 - 400 Thousand/uL   MPV 9.4 7.5 - 12.5 fL   Neutro Abs 4,375 1,500 - 7,800 cells/uL    Absolute Lymphocytes 1,663 850 - 3,900 cells/uL   Absolute Monocytes 731 200 - 950 cells/uL   Eosinophils Absolute 83 15 - 500 cells/uL   Basophils Absolute 48 0 - 200 cells/uL   Neutrophils Relative % 63.4 %   Total Lymphocyte 24.1 %   Monocytes Relative 10.6 %   Eosinophils Relative 1.2 %   Basophils Relative 0.7 %  Hemoglobin A1c   Collection Time: 06/18/23  9:33 AM  Result Value Ref Range   Hgb A1c MFr Bld 5.9 (H) <5.7 % of total Hgb   Mean Plasma Glucose 123 mg/dL   eAG (mmol/L) 6.8 mmol/L  HIV Antibody (routine testing w rflx)   Collection Time: 06/18/23  9:33 AM  Result Value Ref Range   HIV 1&2 Ab, 4th Generation NON-REACTIVE NON-REACTIVE  RPR   Collection Time: 06/18/23  9:33 AM  Result Value Ref Range   RPR Ser Ql NON-REACTIVE NON-REACTIVE  PSA   Collection Time: 06/18/23  9:33 AM  Result Value Ref Range   PSA 2.06 < OR = 4.00 ng/mL  VITAMIN D  25 Hydroxy (Vit-D Deficiency, Fractures)   Collection Time: 06/18/23  9:33 AM  Result Value Ref Range   Vit D, 25-Hydroxy 32 30 - 100 ng/mL          Assessment & Plan:   Problem List Items Addressed This Visit       Nervous and Auditory   Bilateral sensorineural hearing loss - Primary   Relevant Orders   Ambulatory referral to ENT     Assessment and Plan Assessment & Plan Hearing loss Chronic hearing loss with difficulty hearing despite the use of hearing aids. - Refer to ENT at [facility] for further evaluation and management of hearing loss.        Follow up plan: Return if symptoms worsen or fail to improve.

## 2024-06-20 NOTE — Patient Instructions (Signed)
 Preventive Care 73 Years and Older, Male Preventive care refers to lifestyle choices and visits with your health care provider that can promote health and wellness. Preventive care visits are also called wellness exams. What can I expect for my preventive care visit? Counseling During your preventive care visit, your health care provider may ask about your: Medical history, including: Past medical problems. Family medical history. History of falls. Current health, including: Emotional well-being. Home life and relationship well-being. Sexual activity. Memory and ability to understand (cognition). Lifestyle, including: Alcohol, nicotine or tobacco, and drug use. Access to firearms. Diet, exercise, and sleep habits. Work and work Astronomer. Sunscreen use. Safety issues such as seatbelt and bike helmet use. Physical exam Your health care provider will check your: Height and weight. These may be used to calculate your BMI (body mass index). BMI is a measurement that tells if you are at a healthy weight. Waist circumference. This measures the distance around your waistline. This measurement also tells if you are at a healthy weight and may help predict your risk of certain diseases, such as type 2 diabetes and high blood pressure. Heart rate and blood pressure. Body temperature. Skin for abnormal spots. What immunizations do I need?  Vaccines are usually given at various ages, according to a schedule. Your health care provider will recommend vaccines for you based on your age, medical history, and lifestyle or other factors, such as travel or where you work. What tests do I need? Screening Your health care provider may recommend screening tests for certain conditions. This may include: Lipid and cholesterol levels. Diabetes screening. This is done by checking your blood sugar (glucose) after you have not eaten for a while (fasting). Hepatitis C test. Hepatitis B test. HIV (human  immunodeficiency virus) test. STI (sexually transmitted infection) testing, if you are at risk. Lung cancer screening. Colorectal cancer screening. Prostate cancer screening. Abdominal aortic aneurysm (AAA) screening. You may need this if you are a current or former smoker. Talk with your health care provider about your test results, treatment options, and if necessary, the need for more tests. Follow these instructions at home: Eating and drinking  Eat a diet that includes fresh fruits and vegetables, whole grains, lean protein, and low-fat dairy products. Limit your intake of foods with high amounts of sugar, saturated fats, and salt. Take vitamin and mineral supplements as recommended by your health care provider. Do not drink alcohol if your health care provider tells you not to drink. If you drink alcohol: Limit how much you have to 0-2 drinks a day. Know how much alcohol is in your drink. In the [place] one drink equals one 12 oz bottle of beer (355 mL), one 5 oz glass of wine (148 mL), or one 1 oz glass of hard liquor (44 mL). Lifestyle Brush your teeth every morning and night with fluoride toothpaste. Floss one time each day. Exercise for at least 30 minutes 5 or more days each week. Do not use any products that contain nicotine or tobacco. These products include cigarettes, chewing tobacco, and vaping devices, such as e-cigarettes. If you need help quitting, ask your health care provider. Do not use drugs. If you are sexually active, practice safe sex. Use a condom or other form of protection to prevent STIs. Take aspirin only as told by your health care provider. Make sure that you understand how much to take and what form to take. Work with your health care provider to find out whether it is safe  and beneficial for you to take aspirin daily. Ask your health care provider if you need to take a cholesterol-lowering medicine (statin). Find healthy ways to manage stress, such  as: Meditation, yoga, or listening to music. Journaling. Talking to a trusted person. Spending time with friends and family. Safety Always wear your seat belt while driving or riding in a vehicle. Do not drive: If you have been drinking alcohol. Do not ride with someone who has been drinking. When you are tired or distracted. While texting. If you have been using any mind-altering substances or drugs. Wear a helmet and other protective equipment during sports activities. If you have firearms in your house, make sure you follow all gun safety procedures. Minimize exposure to UV radiation to reduce your risk of skin cancer. What's next? Visit your health care provider once a year for an annual wellness visit. Ask your health care provider how often you should have your eyes and teeth checked. Stay up to date on all vaccines. This information is not intended to replace advice given to you by your health care provider. Make sure you discuss any questions you have with your health care provider. Document Revised: 11/14/2020 Document Reviewed: 11/14/2020 Elsevier Patient Education  2024 [employer].

## 2024-06-21 ENCOUNTER — Encounter: Payer: Self-pay | Admitting: Family Medicine

## 2024-06-21 ENCOUNTER — Ambulatory Visit (INDEPENDENT_AMBULATORY_CARE_PROVIDER_SITE_OTHER): Admitting: Family Medicine

## 2024-06-21 VITALS — BP 132/80 | HR 88 | Resp 16 | Ht 76.0 in | Wt 252.3 lb

## 2024-06-21 DIAGNOSIS — E559 Vitamin D deficiency, unspecified: Secondary | ICD-10-CM

## 2024-06-21 DIAGNOSIS — Z79899 Other long term (current) drug therapy: Secondary | ICD-10-CM | POA: Diagnosis not present

## 2024-06-21 DIAGNOSIS — Z0000 Encounter for general adult medical examination without abnormal findings: Secondary | ICD-10-CM | POA: Diagnosis not present

## 2024-06-21 DIAGNOSIS — R739 Hyperglycemia, unspecified: Secondary | ICD-10-CM | POA: Diagnosis not present

## 2024-06-21 DIAGNOSIS — Z125 Encounter for screening for malignant neoplasm of prostate: Secondary | ICD-10-CM

## 2024-06-21 DIAGNOSIS — H903 Sensorineural hearing loss, bilateral: Secondary | ICD-10-CM

## 2024-06-21 DIAGNOSIS — Z23 Encounter for immunization: Secondary | ICD-10-CM

## 2024-06-21 DIAGNOSIS — E7800 Pure hypercholesterolemia, unspecified: Secondary | ICD-10-CM

## 2024-06-21 NOTE — Progress Notes (Signed)
 Name: Raymond Gibson   MRN: [id number]    DOB: 1961-01-02   Date:06/21/2024       Progress Note  Subjective  Chief Complaint  Chief Complaint  Patient presents with   Annual Exam    HPI  Patient presents for annual CPE .   IPSS     Row Name 06/21/24 0955         International Prostate Symptom Score   How often have you had the sensation of not emptying your bladder? Not at All     How often have you had to urinate less than every two hours? Not at All     How often have you found you stopped and started again several times when you urinated? Not at All     How often have you found it difficult to postpone urination? Not at All     How often have you had a weak urinary stream? Not at All     How often have you had to strain to start urination? Not at All     How many times did you typically get up at night to urinate? 2 Times     Total IPSS Score 2       Quality of Life due to urinary symptoms   If you were to spend the rest of your life with your urinary condition just the way it is now how would you feel about that? Pleased        Diet: avoiding cane food due to increase in sodium, buying frozen vegetables, likes sweet potatoes , pecans. No fast food.  Exercise: M- Thursday he goes to fitness center, chest on Monday, legs on Tuesday, Wednesday shoulders, Thursdays back  Last Dental Exam: he has dentures and is thinking about getting implants  Last Eye Exam: up to date   Depression: phq 9 is negative    06/21/2024    9:47 AM 02/16/2024   10:24 AM 12/16/2023    9:51 AM 06/18/2023    8:56 AM 12/15/2022   11:41 AM  Depression screen PHQ 2/9  Decreased Interest 0 0 0 0 0  Down, Depressed, Hopeless 0 0 0 0 0  PHQ - 2 Score 0 0 0 0 0  Altered sleeping  0  0 0  Tired, decreased energy  0  0 0  Change in appetite  0  0 0  Feeling bad or failure about yourself   0  0 0  Trouble concentrating  0  0 0  Moving slowly or fidgety/restless  0  0 0  Suicidal thoughts  0  0 0   PHQ-9 Score  0   0  0   Difficult doing work/chores  Not difficult at all  Not difficult at all      Data saved with a previous flowsheet row definition    Hypertension:  BP Readings from Last 3 Encounters:  06/21/24 132/80  02/16/24 124/72  12/16/23 130/84    Obesity: Wt Readings from Last 3 Encounters:  06/21/24 252 lb 4.8 oz (114.4 kg)  02/16/24 244 lb (110.7 kg)  12/16/23 244 lb 11.2 oz (111 kg)   BMI Readings from Last 3 Encounters:  06/21/24 30.71 kg/m  02/16/24 29.70 kg/m  12/16/23 29.79 kg/m     Flowsheet Row Office Visit from 06/21/2024 in [facility]  AUDIT-C Score 4   He does not drink every day, drinks more when watching a game  Divorced STD testing  and prevention (HIV/chl/gon/syphilis):  not applicable Sexual history: not currently sexually active  Hep C Screening: completed Skin cancer: Discussed monitoring for atypical lesions Colorectal cancer: it is due in 2029 Prostate cancer:  yes Lab Results  Component Value Date   PSA 2.06 06/18/2023   PSA 1.48 05/30/2022   PSA 2.39 05/29/2021    Lung cancer:  Low Dose CT Chest recommended if Age 2-80 years, 30 pack-year currently smoking OR have quit w/in 15years. Patient  is a candidate for screening  but he does not want to have it done  AAA: The USPSTF recommends one-time screening with ultrasonography in men ages 51 to 75 years who have ever smoked. Patient   is a candidate for screening  but he refuses it at this time ECG:  done in 2025  Vaccines: reviewed with the patient.   Advanced Care Planning: A voluntary discussion about advance care planning including the explanation and discussion of advance directives.  Discussed health care proxy and Living will, and the patient was able to identify a health care proxy as ex-wife , Raymond Gibson.  Patient does have a living will and power of attorney of health care   Patient Active Problem List   Diagnosis Date Noted   History  of colonic polyps    Polyp of sigmoid colon    Hyperglycemia 11/20/2015   Vitamin D  insufficiency 08/30/2015   ED (erectile dysfunction) 08/30/2015   Benign neoplasm of transverse colon    Benign neoplasm of sigmoid colon    Snores 01/18/2014   GERD (gastroesophageal reflux disease) 01/18/2014   Bilateral sensorineural hearing loss 08/30/2013   Buzzing in ear 08/30/2013    Past Surgical History:  Procedure Laterality Date   COLONOSCOPY WITH PROPOFOL  N/A 05/22/2015   Procedure: COLONOSCOPY WITH PROPOFOL ;  Surgeon: Raymond Copping, MD;  Location: [facility];  Service: Endoscopy;  Laterality: N/A;   COLONOSCOPY WITH PROPOFOL  N/A 06/19/2020   Procedure: COLONOSCOPY WITH PROPOFOL ;  Surgeon: Gibson Rogelia, MD;  Location: [facility] END[facility];  Service: Endoscopy;  Laterality: N/A;   ELBOW SURGERY     REMOVAL OF ELBOW SPUR   FOOT SURGERY  1978   INNER EAR SURGERY     KNEE SURGERY  07/2013   PTOSIS REPAIR Right 09/04/2015   Procedure: ptosis repair right eye with biopsy of conjunctiva;  Surgeon: Raymond CHRISTELLA Gay, MD;  Location: [facility];  Service: Ophthalmology;  Laterality: Right;  WITH LMA VERY HARD OF HEARING/SPEAK SLOWLY SPECIAL EQUIPMENT:   FRONTALIS SLING INSTRUMENTS SELFF ROD   TONSILLECTOMY      Family History  Problem Relation Age of Onset   Hypertension Mother    Stroke Father     Social History   Socioeconomic History   Marital status: Divorced    Spouse name: Not on file   Number of children: 0   Years of education: Not on file   Highest education level: 12th grade  Occupational History   Occupation: Disabled  Tobacco Use   Smoking status: Every Day    Current packs/day: 1.00    Average packs/day: 1 pack/day for 40.0 years (40.0 ttl pk-yrs)    Types: Cigarettes   Smokeless tobacco: Never  Vaping Use   Vaping status: Never Used  Substance and Sexual Activity   Alcohol use: Yes    Alcohol/week: 5.0 standard drinks of alcohol    Types: 5 Cans of beer per week    Drug use: No   Sexual activity: Yes    Partners: Female  Other Topics Concern   Not on file  Social History Narrative   Not on file   Social Drivers of Health   Tobacco Use: High Risk (06/21/2024)   Patient History    Smoking Tobacco Use: Every Day    Smokeless Tobacco Use: Never    Passive Exposure: Not on file  Financial Resource Strain: Low Risk (06/21/2024)   Overall Financial Resource Strain (CARDIA)    Difficulty of Paying Living Expenses: Not hard at all  Food Insecurity: No Food Insecurity (06/21/2024)   Epic    Worried About Radiation Protection Practitioner of Food in the Last Year: Never true    Ran Out of Food in the Last Year: Never true  Transportation Needs: No Transportation Needs (06/21/2024)   Epic    Lack of Transportation (Medical): No    Lack of Transportation (Non-Medical): No  Physical Activity: Sufficiently Active (06/21/2024)   Exercise Vital Sign    Days of Exercise per Week: 4 days    Minutes of Exercise per Session: 40 min  Stress: No Stress Concern Present (06/21/2024)   [employer] of Occupational Health - Occupational Stress Questionnaire    Feeling of Stress: Not at all  Social Connections: Moderately Isolated (06/21/2024)   Social Connection and Isolation Panel    Frequency of Communication with Friends and Family: More than three times a week    Frequency of Social Gatherings with Friends and Family: More than three times a week    Attends Religious Services: More than 4 times per year    Active Member of Clubs or Organizations: No    Attends Banker Meetings: Never    Marital Status: Divorced  Catering Manager Violence: Not At Risk (06/21/2024)   Epic    Fear of Current or Ex-Partner: No    Emotionally Abused: No    Physically Abused: No    Sexually Abused: No  Depression (PHQ2-9): Low Risk (06/21/2024)   Depression (PHQ2-9)    PHQ-2 Score: 0  Alcohol Screen: Low Risk (06/21/2024)   Alcohol Screen    Last Alcohol Screening Score (AUDIT): 4   Housing: Unknown (06/21/2024)   Epic    Unable to Pay for Housing in the Last Year: No    Number of Times Moved in the Last Year: Not on file    Homeless in the Last Year: No  Utilities: Not At Risk (06/21/2024)   Epic    Threatened with loss of utilities: No  Health Literacy: Adequate Health Literacy (06/21/2024)   B1300 Health Literacy    Frequency of need for help with medical instructions: Rarely    Current Medications[1]  Allergies[2]   ROS  Constitutional: Negative for fever or weight change. Hearing loss, getting ready to have cochlear implant  Respiratory: Negative for cough and shortness of breath.   Cardiovascular: Negative for chest pain or palpitations.  Gastrointestinal: Negative for abdominal pain, no bowel changes.  Musculoskeletal: Negative for gait problem or joint swelling.  Skin: Negative for rash.  Neurological: Negative for dizziness or headache.  No other specific complaints in a complete review of systems (except as listed in HPI above).    Objective  Vitals:   06/21/24 0958  BP: 132/80  Pulse: 88  Resp: 16  SpO2: 98%  Weight: 252 lb 4.8 oz (114.4 kg)  Height: 6' 4 (1.93 m)    Body mass index is 30.71 kg/m.  Physical Exam  Constitutional: Patient appears well-developed and well-nourished. No distress.  HENT: Head: Normocephalic and  atraumatic. Ears: B TMs ok, no erythema or effusion; Nose: Nose normal. Mouth/Throat: Oropharynx is clear and moist. No oropharyngeal exudate.  Eyes: Conjunctivae and EOM are normal. Pupils are equal, round, and reactive to light. No scleral icterus.  Neck: Normal range of motion. Neck supple. No JVD present. No thyromegaly present.  Cardiovascular: Normal rate, regular rhythm and normal heart sounds.  No murmur heard. Trace  BLE edema. Pulmonary/Chest: Effort normal and breath sounds normal. No respiratory distress. Abdominal: Soft. Bowel sounds are normal, no distension. There is no tenderness. no masses MALE  GENITALIA: Normal descended testes bilaterally, no masses palpated, weakness on left inguinal area , no lesions, no discharge RECTAL:not done  Musculoskeletal: Normal range of motion, no joint effusions. No gross deformities Neurological: he is alert and oriented to person, place, and time. No cranial nerve deficit. Coordination, balance, strength, speech and gait are normal.  Skin: Skin is warm and dry. No rash noted. No erythema.  Psychiatric: Patient has a normal mood and affect. behavior is normal. Judgment and thought content normal.     Assessment & Plan  1. Well adult exam (Primary)   2. Immunization due  - Flu vaccine HIGH DOSE PF(Fluzone Trivalent) - Pneumococcal conjugate vaccine 20-valent (Prevnar 20)  3. Bilateral sensorineural hearing loss  Seeing ENT  4. Pure hypercholesterolemia  - Lipid panel  5. Vitamin D  insufficiency  - VITAMIN D  25 Hydroxy (Vit-D Deficiency, Fractures)  6. Hyperglycemia  - Hemoglobin A1c  7. Long-term use of high-risk medication  - Comprehensive metabolic panel with GFR - CBC with Differential/Platelet  8. Prostate cancer screening  - PSA     -Prostate cancer screening and PSA options (with potential risks and benefits of testing vs not testing) were discussed along with recent recs/guidelines. -USPSTF grade A and B recommendations reviewed with patient; age-appropriate recommendations, preventive care, screening tests, etc discussed and encouraged; healthy living encouraged; see AVS for patient education given to patient -Discussed importance of 150 minutes of physical activity weekly, eat two servings of fish weekly, eat one serving of tree nuts ( cashews, pistachios, pecans, almonds.SABRA) every other day, eat 6 servings of fruit/vegetables daily and drink plenty of water and avoid sweet beverages.  -Reviewed Health Maintenance: yes     [1]  Current Outpatient Medications:    Cholecalciferol (VITAMIN D -3) 25 MCG (1000 UT)  CAPS, Take 1 capsule by mouth daily., Disp: , Rfl:    Multiple Vitamin (MULTIVITAMIN WITH MINERALS) TABS tablet, Take 1 tablet by mouth daily., Disp: , Rfl:    rosuvastatin  (CRESTOR ) 20 MG tablet, Take 1 tablet (20 mg total) by mouth daily., Disp: 90 tablet, Rfl: 3   vitamin B-12 (CYANOCOBALAMIN) 100 MCG tablet, Take 100 mcg by mouth daily., Disp: , Rfl:  [2]  Allergies Allergen Reactions   Ceftriaxone Hives   Meropenem     During hospitalization 2/22- 08/22/2013 patient had persistent fevers while on meropenem. 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 Known hives with ceftriaxone.

## 2024-06-22 ENCOUNTER — Ambulatory Visit: Payer: Self-pay | Admitting: Family Medicine

## 2024-06-22 LAB — CBC WITH DIFFERENTIAL/PLATELET
Absolute Lymphocytes: 1691 {cells}/uL (ref 850–3900)
Absolute Monocytes: 713 {cells}/uL (ref 200–950)
Basophils Absolute: 38 {cells}/uL (ref 0–200)
Basophils Relative: 0.4 %
Eosinophils Absolute: 48 {cells}/uL (ref 15–500)
Eosinophils Relative: 0.5 %
HCT: 47.3 % (ref 39.4–51.1)
Hemoglobin: 15.4 g/dL (ref 13.2–17.1)
MCH: 31.6 pg (ref 27.0–33.0)
MCHC: 32.6 g/dL (ref 31.6–35.4)
MCV: 96.9 fL (ref 81.4–101.7)
MPV: 9.3 fL (ref 7.5–12.5)
Monocytes Relative: 7.5 %
Neutro Abs: 7011 {cells}/uL (ref 1500–7800)
Neutrophils Relative %: 73.8 %
Platelets: 283 Thousand/uL (ref 140–400)
RBC: 4.88 Million/uL (ref 4.20–5.80)
RDW: 12.3 % (ref 11.0–15.0)
Total Lymphocyte: 17.8 %
WBC: 9.5 Thousand/uL (ref 3.8–10.8)

## 2024-06-22 LAB — COMPREHENSIVE METABOLIC PANEL WITH GFR
AG Ratio: 1.6 (calc) (ref 1.0–2.5)
ALT: 19 U/L (ref 9–46)
AST: 20 U/L (ref 10–35)
Albumin: 4.4 g/dL (ref 3.6–5.1)
Alkaline phosphatase (APISO): 60 U/L (ref 35–144)
BUN: 23 mg/dL (ref 7–25)
CO2: 28 mmol/L (ref 20–32)
Calcium: 9.6 mg/dL (ref 8.6–10.3)
Chloride: 105 mmol/L (ref 98–110)
Creat: 1.33 mg/dL (ref 0.70–1.35)
Globulin: 2.8 g/dL (ref 1.9–3.7)
Glucose, Bld: 81 mg/dL (ref 65–99)
Potassium: 4.1 mmol/L (ref 3.5–5.3)
Sodium: 139 mmol/L (ref 135–146)
Total Bilirubin: 0.6 mg/dL (ref 0.2–1.2)
Total Protein: 7.2 g/dL (ref 6.1–8.1)
eGFR: 59 mL/min/1.73m2 — ABNORMAL LOW

## 2024-06-22 LAB — LIPID PANEL
Cholesterol: 111 mg/dL
HDL: 59 mg/dL
LDL Cholesterol (Calc): 35 mg/dL
Non-HDL Cholesterol (Calc): 52 mg/dL
Total CHOL/HDL Ratio: 1.9 (calc)
Triglycerides: 90 mg/dL

## 2024-06-22 LAB — HEMOGLOBIN A1C
Hgb A1c MFr Bld: 5.8 % — ABNORMAL HIGH
Mean Plasma Glucose: 120 mg/dL
eAG (mmol/L): 6.6 mmol/L

## 2024-06-22 LAB — PSA: PSA: 2.21 ng/mL

## 2024-06-22 LAB — VITAMIN D 25 HYDROXY (VIT D DEFICIENCY, FRACTURES): Vit D, 25-Hydroxy: 36 ng/mL (ref 30–100)

## 2024-12-16 ENCOUNTER — Ambulatory Visit: Admitting: Family Medicine

## 2025-06-23 ENCOUNTER — Encounter: Admitting: Family Medicine
